# Patient Record
Sex: Male | Born: 1965 | Race: White | Hispanic: No | Marital: Married | Smoking: Never smoker
Health system: Southern US, Community
[De-identification: ages and names within clinical notes are randomized; demographics above are authoritative.]

## PROBLEM LIST (undated history)

## (undated) DIAGNOSIS — Z5189 Encounter for other specified aftercare: Secondary | ICD-10-CM

## (undated) DIAGNOSIS — I10 Essential (primary) hypertension: Secondary | ICD-10-CM

## (undated) DIAGNOSIS — I219 Acute myocardial infarction, unspecified: Secondary | ICD-10-CM

## (undated) DIAGNOSIS — D649 Anemia, unspecified: Secondary | ICD-10-CM

## (undated) DIAGNOSIS — K2270 Barrett's esophagus without dysplasia: Secondary | ICD-10-CM

## (undated) DIAGNOSIS — K2950 Unspecified chronic gastritis without bleeding: Secondary | ICD-10-CM

## (undated) DIAGNOSIS — E119 Type 2 diabetes mellitus without complications: Secondary | ICD-10-CM

## (undated) HISTORY — PX: CORONARY ARTERY BYPASS GRAFT: SHX141

## (undated) HISTORY — PX: CARDIAC CATHETERIZATION: SHX172

## (undated) HISTORY — DX: Anemia, unspecified: D64.9

## (undated) HISTORY — DX: Type 2 diabetes mellitus without complications: E11.9

## (undated) HISTORY — DX: Essential (primary) hypertension: I10

## (undated) HISTORY — DX: Unspecified chronic gastritis without bleeding: K29.50

## (undated) HISTORY — DX: Encounter for other specified aftercare: Z51.89

## (undated) HISTORY — DX: Acute myocardial infarction, unspecified: I21.9

## (undated) HISTORY — PX: CORONARY ANGIOPLASTY: SHX604

## (undated) HISTORY — PX: OTHER SURGICAL HISTORY: SHX169

---

## 2019-08-30 LAB — HM COLONOSCOPY

## 2021-09-02 NOTE — Progress Notes (Signed)
?I,Phillip Robles,acting as a scribe for [employer], PA-C.,have documented all relevant documentation on the behalf of Phillip Kirschner, PA-C,as directed by  Phillip Kirschner, PA-C while in the presence of Phillip Kirschner, PA-C. ? ?New Patient Office Visit ? ?Subjective:  ?Patient ID: Phillip Robles, male    DOB: 1966/03/27  Age: 56 y.o. MRN: [id number] ? ?CC: new patient ? ? ?HPI ?Phillip Robles presents for establishing care, coming from [facility]. Would like to discuss cardiovasular problems and gastrointestinal problems. ? ?DM2 ?--Does not check fasting blood sugars. Manages with jardiance 25 mg, metformin 1000 mg BID, victoza 1.8 mg daily. ?--Reports unchanged peripheral neuropathy, mainly a numbness bilaterally entire bottom of foot. 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  ? ?Anemia of unknown origin ?--Maybe two years ago, was hospitalized. S/p one iron infusion and stabilized.  ? ?History of MI ?--does not have cardiologist here. Reports Mis at 4, 8, 49, most recently 2019 s/p CABG. Reports at least 13 stents.  ?--manages risk with atorvastatin 80 mg and zetia 10 mg. Does not currently smoke. ? ?HTN w/ CHF?  ?--Reports last echo w/in the last calendar year was WNL. Takes Entresto 24-26 mg ? ?Left thumb ?--He reports injury 6 months ago, tried to catch a 2x4. Continues to have L thumb/hand pain. Denies edema, ecchymosis. Grip weakness. ? ?History of gastritis w/ metaplasia ?--Currently unmedicated. Reports being in the middle of a work up in [employer]  ? ?Reports not consuming alcohol since 2021. Denies history of cigarette use, other tobacco use that stopped in 2020.  ?Reports last colonoscopy was around 2 years ago when they were looking for a source of anemia. ?Family history + heart disease ? ?Unclear vaccination history, but he is requesting shingles vaccine today. ? ?Past Medical History:  ?Diagnosis Date  ? Anemia   ? Blood transfusion without reported diagnosis   ?  Chronic gastritis w/ Metaplasia   ? Diabetes mellitus without complication ([place])   ? Hypertension   ? Myocardial infarction [facility])   ? ? ?History reviewed. No pertinent surgical history. ? ?Family History  ?Problem Relation Age of Onset  ? Heart Problems Mother   ? Heart Problems Father   ? Bipolar disorder Sister   ? COPD Maternal Uncle   ? Heart Problems Paternal Uncle   ? Cancer Maternal Grandmother   ? Heart Problems Paternal Grandmother   ? Heart Problems Paternal Grandfather   ? ? ?Social History  ? ?Socioeconomic History  ? Marital status: Married  ?  Spouse name: Not on file  ? Number of children: Not on file  ? Years of education: Not on file  ? Highest education level: Not on file  ?Occupational History  ? Not on file  ?Tobacco Use  ? Smoking status: Never  ? Smokeless tobacco: Former  ?  Quit date: 03/2019  ?Substance and Sexual Activity  ? Alcohol use: Not Currently  ?  Comment: Quit 07/2019  ? Drug use: Never  ? Sexual activity: Yes  ?Other Topics Concern  ? Not on file  ?Social History Narrative  ? Not on file  ? ?Social Determinants of Health  ? ?Financial Resource Strain: Not on file  ?Food Insecurity: Not on file  ?Transportation Needs: Not on file  ?Physical Activity: Not on file  ?Stress: Not on file  ?Social Connections: Not on file  ?Intimate Partner Violence: Not on file  ? ? ?ROS ?Review of Systems  ?Eyes:  Positive for visual disturbance.  ?Musculoskeletal:  Positive for arthralgias and myalgias.  ? ?Objective:  ?Blood pressure 132/78, pulse 80, height 6\' 4"  (1.93 m), weight 233 lb 14.4 oz (106.1 kg), SpO2 99 %.  ?Today's Vitals: BP 132/78 (BP Location: Right Arm, Patient Position: Sitting, Cuff Size: Normal)   Pulse 80   Ht 6\' 4"  (1.93 m)   Wt 233 lb 14.4 oz (106.1 kg)   SpO2 99%   BMI 28.47 kg/m?  ? ?Physical Exam ?Constitutional:   ?   General: He is awake.  ?   Appearance: He is well-developed.  ?HENT:  ?   Head: Normocephalic.  ?Eyes:  ?   Conjunctiva/sclera: Conjunctivae normal.   ?Cardiovascular:  ?   Rate and Rhythm: Normal rate and regular rhythm.  ?   Pulses:     ?     Dorsalis pedis pulses are 3+ on the right side and 3+ on the left side.  ?   Heart sounds: Normal heart sounds.  ?Pulmonary:  ?   Effort: Pulmonary effort is normal.  ?   Breath sounds: Normal breath sounds.  ?Musculoskeletal:  ?   Right lower leg: No edema.  ?   Left lower leg: No edema.  ?Feet:  ?   Right foot:  ?   Protective Sensation: 3 sites tested.  0 sites sensed.  ?   Skin integrity: Skin integrity normal.  ?   Left foot:  ?   Protective Sensation: 3 sites tested.   1 site sensed. ?   Skin integrity: Skin integrity normal.  ?   Comments: L great toe surgically absent ?Skin: ?   General: Skin is warm.  ?Neurological:  ?   Mental Status: He is alert and oriented to person, place, and time.  ?Psychiatric:     ?   Attention and Perception: Attention normal.     ?   Mood and Affect: Mood normal.     ?   Speech: Speech normal.     ?   Behavior: Behavior is cooperative.  ? ? ?Assessment & Plan:  ? ?Problem List Items Addressed This Visit   ? ?  ? Cardiovascular and Mediastinum  ? Hypertension associated with diabetes ([place])  ?  Appropriate range today. Will continue to monitor ? ?  ?  ? Relevant Medications  ? atorvastatin (LIPITOR) 80 MG tablet  ? JARDIANCE 25 MG TABS tablet  ? ezetimibe (ZETIA) 10 MG tablet  ? VICTOZA 18 MG/3ML SOPN  ? metFORMIN (GLUCOPHAGE) 1000 MG tablet  ? metoprolol succinate (TOPROL-XL) 25 MG 24 hr tablet  ? ENTRESTO 24-26 MG  ? aspirin EC 81 MG tablet  ?  ? Digestive  ? Gastric intestinal metaplasia  ?  Historically, was in the middle of a w/u in [place], requests referral to GI. Not currently medicated ?  ?  ? Relevant Orders  ? Ambulatory referral to Gastroenterology  ? RESOLVED: GERD (gastroesophageal reflux disease)  ?  ? Endocrine  ? Diabetes mellitus without complication ([place]) - Primary  ?  Manages with metformin 1000 mg BID, jardiance 25 mg, Victoza 1.8 mg daily. ?Given a freestyle  libre, reports continuous monitoring helping before to control his sugars ?A1c. cmp ordered ?F/u 3 mo ?  ?  ? Relevant Medications  ? atorvastatin (LIPITOR) 80 MG tablet  ? JARDIANCE 25 MG TABS tablet  ? VICTOZA 18 MG/3ML SOPN  ? metFORMIN (GLUCOPHAGE) 1000 MG tablet  ? aspirin EC 81 MG tablet  ? Hyperlipidemia associated with type 2 diabetes mellitus (  [place])  ?  Will check lipid profile ?Manages with atorvastatin 80 mg and zetia 10 mg ?  ?  ? Relevant Medications  ? atorvastatin (LIPITOR) 80 MG tablet  ? JARDIANCE 25 MG TABS tablet  ? ezetimibe (ZETIA) 10 MG tablet  ? VICTOZA 18 MG/3ML SOPN  ? metFORMIN (GLUCOPHAGE) 1000 MG tablet  ? metoprolol succinate (TOPROL-XL) 25 MG 24 hr tablet  ? ENTRESTO 24-26 MG  ? aspirin EC 81 MG tablet  ? Other Relevant Orders  ? Comprehensive metabolic panel  ? Lipid panel  ?  ? Other  ? History of MI (myocardial infarction)  ?  Ref to cardiology, on statin, asa daily ?Will assess lipid panel, cmp ?  ?  ? Relevant Orders  ? Ambulatory referral to Cardiology  ? History of anemia  ?  Will eval cbc and iron profile ?  ?  ? Relevant Orders  ? CBC w/Diff/Platelet  ? Iron, TIBC and Ferritin Panel  ? History of diabetic retinopathy  ?  Ref to optho pt reports history of injections ?  ?  ? Relevant Orders  ? Ambulatory referral to Ophthalmology  ? Pain of left thumb  ?  Will start with xrays to assess for arthritis  ?Can refer to ortho in future if indicated ?  ?  ? Relevant Orders  ? DG Hand Complete Left  ? ?Other Visit Diagnoses   ? ? Encounter for screening for HIV      ? Relevant Orders  ? HIV antibody (with reflex)  ? Encounter for hepatitis C screening test for low risk patient      ? Relevant Orders  ? Hepatitis C antibody  ? Encounter for screening for malignant neoplasm of prostate      ? Relevant Orders  ? PSA  ? Need for shingles vaccine      ? Relevant Orders  ? Varicella-zoster vaccine IM (Shingrix) (Completed)  ? ?  ?  ? ?Outpatient Encounter Medications as of 09/03/2021   ?Medication Sig  ? aspirin EC 81 MG tablet Take 81 mg by mouth daily. Swallow whole.  ? atorvastatin (LIPITOR) 80 MG tablet Take 80 mg by mouth daily.  ? dorzolamide (TRUSOPT) 2 % ophthalmic solution SMARTSIG:In Eye(s)  ? ENTRES

## 2021-09-03 ENCOUNTER — Ambulatory Visit (INDEPENDENT_AMBULATORY_CARE_PROVIDER_SITE_OTHER): Payer: BC Managed Care – PPO | Admitting: Physician Assistant

## 2021-09-03 ENCOUNTER — Encounter: Payer: Self-pay | Admitting: Physician Assistant

## 2021-09-03 ENCOUNTER — Ambulatory Visit
Admission: RE | Admit: 2021-09-03 | Discharge: 2021-09-03 | Disposition: A | Discharge status: Home / Self Care | Payer: BC Managed Care – PPO | Attending: Physician Assistant | Admitting: Physician Assistant

## 2021-09-03 ENCOUNTER — Other Ambulatory Visit: Payer: Self-pay

## 2021-09-03 ENCOUNTER — Ambulatory Visit
Admission: RE | Admit: 2021-09-03 | Discharge: 2021-09-03 | Disposition: A | Discharge status: Home / Self Care | Payer: BC Managed Care – PPO | Source: Ambulatory Visit | Attending: Physician Assistant | Admitting: Physician Assistant

## 2021-09-03 VITALS — BP 132/78 | HR 80 | Ht 76.0 in | Wt 233.9 lb

## 2021-09-03 DIAGNOSIS — E1142 Type 2 diabetes mellitus with diabetic polyneuropathy: Secondary | ICD-10-CM

## 2021-09-03 DIAGNOSIS — Z1159 Encounter for screening for other viral diseases: Secondary | ICD-10-CM

## 2021-09-03 DIAGNOSIS — E1169 Type 2 diabetes mellitus with other specified complication: Secondary | ICD-10-CM

## 2021-09-03 DIAGNOSIS — E785 Hyperlipidemia, unspecified: Secondary | ICD-10-CM

## 2021-09-03 DIAGNOSIS — I252 Old myocardial infarction: Secondary | ICD-10-CM | POA: Insufficient documentation

## 2021-09-03 DIAGNOSIS — Z23 Encounter for immunization: Secondary | ICD-10-CM | POA: Diagnosis not present

## 2021-09-03 DIAGNOSIS — Z8739 Personal history of other diseases of the musculoskeletal system and connective tissue: Secondary | ICD-10-CM

## 2021-09-03 DIAGNOSIS — E119 Type 2 diabetes mellitus without complications: Secondary | ICD-10-CM | POA: Diagnosis not present

## 2021-09-03 DIAGNOSIS — Z114 Encounter for screening for human immunodeficiency virus [HIV]: Secondary | ICD-10-CM | POA: Diagnosis not present

## 2021-09-03 DIAGNOSIS — M79642 Pain in left hand: Secondary | ICD-10-CM | POA: Diagnosis not present

## 2021-09-03 DIAGNOSIS — K31A0 Gastric intestinal metaplasia, unspecified: Secondary | ICD-10-CM

## 2021-09-03 DIAGNOSIS — E1159 Type 2 diabetes mellitus with other circulatory complications: Secondary | ICD-10-CM

## 2021-09-03 DIAGNOSIS — I152 Hypertension secondary to endocrine disorders: Secondary | ICD-10-CM

## 2021-09-03 DIAGNOSIS — Z862 Personal history of diseases of the blood and blood-forming organs and certain disorders involving the immune mechanism: Secondary | ICD-10-CM

## 2021-09-03 DIAGNOSIS — K219 Gastro-esophageal reflux disease without esophagitis: Secondary | ICD-10-CM

## 2021-09-03 DIAGNOSIS — M79645 Pain in left finger(s): Secondary | ICD-10-CM | POA: Diagnosis not present

## 2021-09-03 DIAGNOSIS — Z125 Encounter for screening for malignant neoplasm of prostate: Secondary | ICD-10-CM

## 2021-09-03 DIAGNOSIS — Z8639 Personal history of other endocrine, nutritional and metabolic disease: Secondary | ICD-10-CM

## 2021-09-03 NOTE — Assessment & Plan Note (Signed)
Ref to optho pt reports history of injections ?

## 2021-09-03 NOTE — Assessment & Plan Note (Addendum)
Historically, was in the middle of a w/u in [place], requests referral to GI. Not currently medicated ?

## 2021-09-03 NOTE — Assessment & Plan Note (Signed)
Ref to cardiology, on statin, asa daily ?Will assess lipid panel, cmp ?

## 2021-09-03 NOTE — Assessment & Plan Note (Signed)
Will check lipid profile ?Manages with atorvastatin 80 mg and zetia 10 mg ?

## 2021-09-03 NOTE — Assessment & Plan Note (Addendum)
Manages with metformin 1000 mg BID, jardiance 25 mg, Victoza 1.8 mg daily. ?Given a freestyle libre, reports continuous monitoring helping before to control his sugars ?A1c. cmp ordered ?F/u 3 mo ?

## 2021-09-03 NOTE — Assessment & Plan Note (Signed)
Appropriate range today. Will continue to monitor ? ?

## 2021-09-03 NOTE — Assessment & Plan Note (Signed)
Will start with xrays to assess for arthritis  ?Can refer to ortho in future if indicated ?

## 2021-09-03 NOTE — Assessment & Plan Note (Signed)
Will eval cbc and iron profile ?

## 2021-09-04 ENCOUNTER — Other Ambulatory Visit: Payer: Self-pay | Admitting: Physician Assistant

## 2021-09-04 DIAGNOSIS — R748 Abnormal levels of other serum enzymes: Secondary | ICD-10-CM

## 2021-09-04 LAB — COMPREHENSIVE METABOLIC PANEL
ALT: 31 IU/L (ref 0–44)
AST: 28 IU/L (ref 0–40)
Albumin/Globulin Ratio: 2.1 (ref 1.2–2.2)
Albumin: 4.7 g/dL (ref 3.8–4.9)
Alkaline Phosphatase: 168 IU/L — ABNORMAL HIGH (ref 44–121)
BUN/Creatinine Ratio: 19 (ref 9–20)
BUN: 19 mg/dL (ref 6–24)
Bilirubin Total: 1.6 mg/dL — ABNORMAL HIGH (ref 0.0–1.2)
CO2: 22 mmol/L (ref 20–29)
Calcium: 10.5 mg/dL — ABNORMAL HIGH (ref 8.7–10.2)
Chloride: 103 mmol/L (ref 96–106)
Creatinine, Ser: 1 mg/dL (ref 0.76–1.27)
Globulin, Total: 2.2 g/dL (ref 1.5–4.5)
Glucose: 140 mg/dL — ABNORMAL HIGH (ref 70–99)
Potassium: 4.9 mmol/L (ref 3.5–5.2)
Sodium: 143 mmol/L (ref 134–144)
Total Protein: 6.9 g/dL (ref 6.0–8.5)
eGFR: 88 mL/min/{1.73_m2} (ref >59)

## 2021-09-04 LAB — PSA: Prostate Specific Ag, Serum: 0.5 ng/mL (ref 0.0–4.0)

## 2021-09-04 LAB — LIPID PANEL
Chol/HDL Ratio: 2.3 ratio (ref 0.0–5.0)
Cholesterol, Total: 116 mg/dL (ref 100–199)
HDL: 51 mg/dL (ref >39)
LDL Chol Calc (NIH): 48 mg/dL (ref 0–99)
Triglycerides: 90 mg/dL (ref 0–149)
VLDL Cholesterol Cal: 17 mg/dL (ref 5–40)

## 2021-09-04 LAB — IRON,TIBC AND FERRITIN PANEL
Ferritin: 68 ng/mL (ref 30–400)
Iron Saturation: 35 % (ref 15–55)
Iron: 129 ug/dL (ref 38–169)
Total Iron Binding Capacity: 369 ug/dL (ref 250–450)
UIBC: 240 ug/dL (ref 111–343)

## 2021-09-04 LAB — CBC WITH DIFFERENTIAL/PLATELET
Basophils Absolute: 0.1 10*3/uL (ref 0.0–0.2)
Basos: 1 %
EOS (ABSOLUTE): 0.2 10*3/uL (ref 0.0–0.4)
Eos: 4 %
Hematocrit: 43.8 % (ref 37.5–51.0)
Hemoglobin: 15 g/dL (ref 13.0–17.7)
Immature Grans (Abs): 0 10*3/uL (ref 0.0–0.1)
Immature Granulocytes: 0 %
Lymphocytes Absolute: 0.9 10*3/uL (ref 0.7–3.1)
Lymphs: 14 %
MCH: 31.4 pg (ref 26.6–33.0)
MCHC: 34.2 g/dL (ref 31.5–35.7)
MCV: 92 fL (ref 79–97)
Monocytes Absolute: 0.4 10*3/uL (ref 0.1–0.9)
Monocytes: 6 %
Neutrophils Absolute: 4.8 10*3/uL (ref 1.4–7.0)
Neutrophils: 75 %
Platelets: 144 10*3/uL — ABNORMAL LOW (ref 150–450)
RBC: 4.78 x10E6/uL (ref 4.14–5.80)
RDW: 12.8 % (ref 11.6–15.4)
WBC: 6.4 10*3/uL (ref 3.4–10.8)

## 2021-09-04 LAB — HIV ANTIBODY (ROUTINE TESTING W REFLEX): HIV Screen 4th Generation wRfx: NONREACTIVE

## 2021-09-04 LAB — HEMOGLOBIN A1C
Est. average glucose Bld gHb Est-mCnc: 166 mg/dL
Hgb A1c MFr Bld: 7.4 % — ABNORMAL HIGH (ref 4.8–5.6)

## 2021-09-04 LAB — HEPATITIS C ANTIBODY: Hep C Virus Ab: NONREACTIVE

## 2021-09-07 ENCOUNTER — Encounter: Payer: Self-pay | Admitting: Physician Assistant

## 2021-09-08 ENCOUNTER — Other Ambulatory Visit: Payer: Self-pay | Admitting: Physician Assistant

## 2021-09-08 DIAGNOSIS — M79645 Pain in left finger(s): Secondary | ICD-10-CM

## 2021-09-09 DIAGNOSIS — E11311 Type 2 diabetes mellitus with unspecified diabetic retinopathy with macular edema: Secondary | ICD-10-CM | POA: Diagnosis not present

## 2021-09-09 DIAGNOSIS — Z0100 Encounter for examination of eyes and vision without abnormal findings: Secondary | ICD-10-CM | POA: Diagnosis not present

## 2021-09-09 LAB — HM DIABETES EYE EXAM

## 2021-09-10 DIAGNOSIS — M79645 Pain in left finger(s): Secondary | ICD-10-CM | POA: Diagnosis not present

## 2021-09-22 DIAGNOSIS — E113313 Type 2 diabetes mellitus with moderate nonproliferative diabetic retinopathy with macular edema, bilateral: Secondary | ICD-10-CM | POA: Diagnosis not present

## 2021-09-22 LAB — HM DIABETES EYE EXAM

## 2021-09-23 DIAGNOSIS — M79645 Pain in left finger(s): Secondary | ICD-10-CM | POA: Diagnosis not present

## 2021-09-29 DIAGNOSIS — S5332XA Traumatic rupture of left ulnar collateral ligament, initial encounter: Secondary | ICD-10-CM | POA: Diagnosis not present

## 2021-09-30 DIAGNOSIS — E113313 Type 2 diabetes mellitus with moderate nonproliferative diabetic retinopathy with macular edema, bilateral: Secondary | ICD-10-CM | POA: Diagnosis not present

## 2021-10-01 ENCOUNTER — Ambulatory Visit (INDEPENDENT_AMBULATORY_CARE_PROVIDER_SITE_OTHER): Payer: BC Managed Care – PPO | Admitting: Cardiology

## 2021-10-01 ENCOUNTER — Encounter: Payer: Self-pay | Admitting: Cardiology

## 2021-10-01 VITALS — BP 112/70 | HR 94 | Ht 76.0 in | Wt 235.0 lb

## 2021-10-01 DIAGNOSIS — I10 Essential (primary) hypertension: Secondary | ICD-10-CM | POA: Diagnosis not present

## 2021-10-01 DIAGNOSIS — E7800 Pure hypercholesterolemia, unspecified: Secondary | ICD-10-CM

## 2021-10-01 DIAGNOSIS — Z951 Presence of aortocoronary bypass graft: Secondary | ICD-10-CM | POA: Diagnosis not present

## 2021-10-01 NOTE — Patient Instructions (Signed)
Medication Instructions:  ? ?Your physician recommends that you continue on your current medications as directed. Please refer to the Current Medication list given to you today. ? ?*If you need a refill on your cardiac medications before your next appointment, please call your pharmacy* ? ? ?Lab Work: ?None ordered ? ?If you have labs (blood work) drawn today and your tests are completely normal, you will receive your results only by: ?MyChart Message (if you have MyChart) OR ?A paper copy in the mail ?If you have any lab test that is abnormal or we need to change your treatment, we will call you to review the results. ? ? ?Testing/Procedures: ? ?Your physician has requested that you have an echocardiogram. Echocardiography is a painless test that uses sound waves to create images of your heart. It provides your doctor with information about the size and shape of your heart and how well your heart?s chambers and valves are working. This procedure takes approximately one hour. There are no restrictions for this procedure. ? ? ? ?Follow-Up: ?At [facility], you and your health needs are our priority.  As part of our continuing mission to provide you with exceptional heart care, we have created designated Provider Care Teams.  These Care Teams include your primary Cardiologist (physician) and Advanced Practice Providers (APPs -  Physician Assistants and Nurse Practitioners) who all work together to provide you with the care you need, when you need it. ? ?We recommend signing up for the patient portal called "MyChart".  Sign up information is provided on this After Visit Summary.  MyChart is used to connect with patients for Virtual Visits (Telemedicine).  Patients are able to view lab/test results, encounter notes, upcoming appointments, etc.  Non-urgent messages can be sent to your provider as well.   ?To learn more about what you can do with MyChart, go to [email or web address].   ? ?Your next appointment:    ?2-3 month(s) ? ?The format for your next appointment:   ?In Person ? ?Provider:   ?You may see Debbe Odea, MD or one of the following Advanced Practice Providers on your designated Care Team:   ?Nicolasa Ducking, NP ?Eula Listen, PA-C ?Cadence Fransico Michael, PA-C  ? ? ?Other Instructions ? ? ?Important Information About Sugar ? ? ? ? ? ? ?

## 2021-10-01 NOTE — Progress Notes (Signed)
?Cardiology Office Note:   ? ?Date:  10/01/2021  ? ?ID:  Phillip Robles, DOB 10-Mar-1966, MRN [id number] ? ?PCP:  [name], PA-C ?  ?[facility] Providers ?Cardiologist:  None    ? ?Referring MD: [name], PA-C  ? ?Chief Complaint  ?Patient presents with  ? New Patient (Initial Visit)  ?  Referred by PCP for hx with MI / CABG. Meds reviewed verbally with patient.   ? ?Phillip Robles is a 56 y.o. male who is being seen today for the evaluation of cardiovascular risk at the request of Drubel, Ria Comment, [place]. ? ? ?History of Present Illness:   ? ?Phillip Robles is a 56 y.o. male with a hx of CAD (prior PCI 2007, 2009), MI s/p CABG x 5 2009, hypertension, hyperlipidemia, diabetes who presents to establish care. ? ?Patient recently moved to the area from [facility].  Had an MI in 2007 requiring stents.  2 years later he had additional stent placements.  In December 2009, due to persistent symptoms of chest discomfort, he underwent left heart cath showing multivessel disease requiring CABG.  A repeat left heart cath in 2011 and was told everything was okay. ? ?Has been doing well over the past year or so.  Denies chest pain or shortness of breath.  Takes medications as prescribed. ? ? ? ?Past Medical History:  ?Diagnosis Date  ? Anemia   ? Blood transfusion without reported diagnosis   ? Chronic gastritis w/ Metaplasia   ? Diabetes mellitus without complication ([place])   ? Hypertension   ? Myocardial infarction [facility])   ? ? ?History reviewed. No pertinent surgical history. ? ?Current Medications: ?Current Meds  ?Medication Sig  ? aspirin EC 81 MG tablet Take 81 mg by mouth daily. Swallow whole.  ? atorvastatin (LIPITOR) 80 MG tablet Take 80 mg by mouth daily.  ? dorzolamide (TRUSOPT) 2 % ophthalmic solution SMARTSIG:In Eye(s)  ? ENTRESTO 24-26 MG Take 1 tablet by mouth 2 (two) times daily.  ? ezetimibe (ZETIA) 10 MG tablet Take 10 mg by mouth daily.  ? JARDIANCE 25 MG TABS tablet Take 25 mg by mouth daily.   ? metFORMIN (GLUCOPHAGE) 1000 MG tablet Take 1,000 mg by mouth 2 (two) times daily.  ? metoprolol succinate (TOPROL-XL) 25 MG 24 hr tablet Take 25 mg by mouth 2 (two) times daily.  ? timolol (TIMOPTIC) 0.5 % ophthalmic solution SMARTSIG:In Eye(s)  ? VICTOZA 18 MG/3ML SOPN Inject 1.8 mg into the skin daily.  ?  ? ?Allergies:   Other  ? ?Social History  ? ?Socioeconomic History  ? Marital status: Married  ?  Spouse name: Not on file  ? Number of children: Not on file  ? Years of education: Not on file  ? Highest education level: Not on file  ?Occupational History  ? Not on file  ?Tobacco Use  ? Smoking status: Never  ? Smokeless tobacco: Former  ?  Quit date: 03/2019  ?Substance and Sexual Activity  ? Alcohol use: Not Currently  ?  Comment: Quit 07/2019  ? Drug use: Never  ? Sexual activity: Yes  ?Other Topics Concern  ? Not on file  ?Social History Narrative  ? Not on file  ? ?Social Determinants of Health  ? ?Financial Resource Strain: Not on file  ?Food Insecurity: Not on file  ?Transportation Needs: Not on file  ?Physical Activity: Not on file  ?Stress: Not on file  ?Social Connections: Not on file  ?  ? ?Family History: ?  The patient's family history includes Bipolar disorder in his sister; COPD in his maternal uncle; Cancer in his maternal grandmother; Heart Problems in his father, mother, paternal grandfather, paternal grandmother, and paternal uncle. ? ?ROS:   ?Please see the history of present illness.    ? All other systems reviewed and are negative. ? ?EKGs/Labs/Other Studies Reviewed:   ? ?The following studies were reviewed today: ? ? ?EKG:  EKG is  ordered today.  The ekg ordered today demonstrates normal sinus rhythm, low voltage QRS, possible old inferior infarct ? ?Recent Labs: ?09/03/2021: ALT 31; BUN 19; Creatinine, Ser 1.00; Hemoglobin 15.0; Platelets 144; Potassium 4.9; Sodium 143  ?Recent Lipid Panel ?   ?Component Value Date/Time  ? CHOL 116 09/03/2021 1114  ? TRIG 90 09/03/2021 1114  ? HDL 51  09/03/2021 1114  ? CHOLHDL 2.3 09/03/2021 1114  ? [place] 48 09/03/2021 1114  ? ? ? ?Risk Assessment/Calculations:   ? ? ?    ? ?Physical Exam:   ? ?VS:  BP 112/70 (BP Location: Left Arm, Patient Position: Sitting, Cuff Size: Normal)   Pulse 94   Ht 6\' 4"  (1.93 m)   Wt 235 lb (106.6 kg)   SpO2 97%   BMI 28.61 kg/m?    ? ?Wt Readings from Last 3 Encounters:  ?10/01/21 235 lb (106.6 kg)  ?09/03/21 233 lb 14.4 oz (106.1 kg)  ?  ? ?GEN:  Well nourished, well developed in no acute distress ?HEENT: Normal ?NECK: No JVD; No carotid bruits ?CARDIAC: RRR, no murmurs, rubs, gallops ?RESPIRATORY:  Clear to auscultation without rales, wheezing or rhonchi  ?ABDOMEN: Soft, non-tender, non-distended ?MUSCULOSKELETAL:  No edema; No deformity  ?SKIN: Warm and dry ?NEUROLOGIC:  Alert and oriented x 3 ?PSYCHIATRIC:  Normal affect  ? ?ASSESSMENT:   ? ?1. Hx of CABG   ?2. Primary hypertension   ?3. Pure hypercholesterolemia   ? ?PLAN:   ? ?In order of problems listed above: ? ?CAD/CABG x5.  Denies chest pain.  Get echocardiogram to evaluate function.  Continue aspirin, Toprol-XL, Lipitor 80. ?Hypertension, BP controlled.  Continue Toprol-XL, Entresto. ?Hyperlipidemia, cholesterol controlled, LDL at goal.  Continue Lipitor 80. ? ?Follow-up after echo.  If echo otherwise normal, will plan for annual visits. ? ?   ? ? ? ?Medication Adjustments/Labs and Tests Ordered: ?Current medicines are reviewed at length with the patient today.  Concerns regarding medicines are outlined above.  ?Orders Placed This Encounter  ?Procedures  ? ECHOCARDIOGRAM COMPLETE  ? ?No orders of the defined types were placed in this encounter. ? ? ?Patient Instructions  ?Medication Instructions:  ? ?Your physician recommends that you continue on your current medications as directed. Please refer to the Current Medication list given to you today. ? ?*If you need a refill on your cardiac medications before your next appointment, please call your pharmacy* ? ? ?Lab  Work: ?None ordered ? ?If you have labs (blood work) drawn today and your tests are completely normal, you will receive your results only by: ?MyChart Message (if you have MyChart) OR ?A paper copy in the mail ?If you have any lab test that is abnormal or we need to change your treatment, we will call you to review the results. ? ? ?Testing/Procedures: ? ?Your physician has requested that you have an echocardiogram. Echocardiography is a painless test that uses sound waves to create images of your heart. It provides your doctor with information about the size and shape of your heart and how well  your heart?s chambers and valves are working. This procedure takes approximately one hour. There are no restrictions for this procedure. ? ? ? ?Follow-Up: ?At [facility], you and your health needs are our priority.  As part of our continuing mission to provide you with exceptional heart care, we have created designated Provider Care Teams.  These Care Teams include your primary Cardiologist (physician) and Advanced Practice Providers (APPs -  Physician Assistants and Nurse Practitioners) who all work together to provide you with the care you need, when you need it. ? ?We recommend signing up for the patient portal called "MyChart".  Sign up information is provided on this After Visit Summary.  MyChart is used to connect with patients for Virtual Visits (Telemedicine).  Patients are able to view lab/test results, encounter notes, upcoming appointments, etc.  Non-urgent messages can be sent to your provider as well.   ?To learn more about what you can do with MyChart, go to [email or web address].   ? ?Your next appointment:   ?2-3 month(s) ? ?The format for your next appointment:   ?In Person ? ?Provider:   ?You may see Kate Sable, MD or one of the following Advanced Practice Providers on your designated Care Team:   ?Murray Hodgkins, NP ?Christell Faith, PA-C ?Cadence Kathlen Mody, PA-C  ? ? ?Other  Instructions ? ? ?Important Information About Sugar ? ? ? ? ? ?  ? ?Signed, ?Kate Sable, MD  ?10/01/2021 12:38 PM    ?New Site ?

## 2021-10-02 ENCOUNTER — Other Ambulatory Visit: Payer: Self-pay | Admitting: Cardiology

## 2021-10-02 DIAGNOSIS — I25119 Atherosclerotic heart disease of native coronary artery with unspecified angina pectoris: Secondary | ICD-10-CM

## 2021-10-02 DIAGNOSIS — Z951 Presence of aortocoronary bypass graft: Secondary | ICD-10-CM

## 2021-10-05 NOTE — Addendum Note (Signed)
Addended by: Margrett Rud on: 10/05/2021 01:23 PM ? ? Modules accepted: Orders ? ?

## 2021-10-19 ENCOUNTER — Telehealth: Payer: Self-pay | Admitting: Cardiology

## 2021-10-19 NOTE — Telephone Encounter (Signed)
I will update the requesting office the pt has an echo 10/28/21 for further cardiac testing for pre op clearance.

## 2021-10-19 NOTE — Telephone Encounter (Signed)
Primary Cardiologist:None  Chart reviewed as part of pre-operative protocol coverage. Because of Phillip Robles's past medical history he has an echocardiogram ordered by Dr. Garen Lah that is pending In order to better assess preoperative cardiovascular risk, we will need to await results prior to giving clearance.   Pre-op covering staff: - Please contact requesting surgeon's office via preferred method (i.e, phone, fax) to inform them of need for appointment prior to surgery. Echo is scheduled for 10/28/21.  If applicable, this message will also be routed to pharmacy pool and/or primary cardiologist for input on holding anticoagulant/antiplatelet agent as requested below so that this information is available at time of patient's appointment.   Phillip Life, NP-C    10/19/2021, 4:11 PM [place] [id number], [address] Office [phone] Fax [phone]

## 2021-10-19 NOTE — Telephone Encounter (Signed)
   Pre-operative Risk Assessment    Patient Name: Phillip Robles  DOB: 1965-07-07 MRN: [id number]      Request for Surgical Clearance    Procedure:   left thumb ucl vs. reconstruction  Date of Surgery:  Clearance 10/30/21                                 Surgeon:  Dr Audery Amel Group or Practice Name:  [facility]  Phone number:  [phone] Fax number:  [phone]   Type of Clearance Requested:   - Medical    Type of Anesthesia:   Regional block   Additional requests/questions:    Courtney Heys   10/19/2021, 3:51 PM

## 2021-10-28 ENCOUNTER — Ambulatory Visit (INDEPENDENT_AMBULATORY_CARE_PROVIDER_SITE_OTHER): Payer: BC Managed Care – PPO

## 2021-10-28 DIAGNOSIS — I25119 Atherosclerotic heart disease of native coronary artery with unspecified angina pectoris: Secondary | ICD-10-CM

## 2021-10-28 DIAGNOSIS — Z951 Presence of aortocoronary bypass graft: Secondary | ICD-10-CM | POA: Diagnosis not present

## 2021-10-28 LAB — ECHOCARDIOGRAM COMPLETE
AR max vel: 3.37 cm2
AV Area VTI: 3.97 cm2
AV Area mean vel: 3.58 cm2
AV Mean grad: 2 mmHg
AV Peak grad: 4.4 mmHg
Ao pk vel: 1.05 m/s

## 2021-10-28 MED ORDER — PERFLUTREN LIPID MICROSPHERE
1.0000 mL | INTRAVENOUS | Status: AC | PRN
  Administered 2021-10-28: 2 mL via INTRAVENOUS

## 2021-10-29 ENCOUNTER — Telehealth: Payer: Self-pay

## 2021-10-29 NOTE — Telephone Encounter (Signed)
   Name: Phillip Robles  DOB: 1965-06-08  MRN: [id number]   Primary Cardiologist: None  Chart reviewed as part of pre-operative protocol coverage. Patient was contacted 10/29/2021 in reference to pre-operative risk assessment for pending surgery as outlined below.  Phillip Robles was last seen on 10/01/2021 by Dr. Azucena Cecil.  Since that day, Phillip Robles has done well without exertional chest pain or worsening dyspnea.  He is able to walk several miles per day without any issue.  He is clearly able to accomplish more than 4 METS of activity.  Echocardiogram obtained yesterday showed EF 45 to 50% which is borderline low, we do not have a old echocardiogram to compare, however patient says his previous cardiologist mention his EF is closer to 50 to 55%.  Given similarity in the ejection fraction and the lack of symptom, patient is at acceptable risk to proceed with surgery without further cardiovascular testing.  Therefore, based on ACC/AHA guidelines, the patient would be at acceptable risk for the planned procedure without further cardiovascular testing.   The patient was advised that if he develops new symptoms prior to surgery to contact our office to arrange for a follow-up visit, and he verbalized understanding.  I will route this recommendation to the requesting party via Epic fax function and remove from pre-op pool. Please call with questions.  [place], [place] 10/29/2021, 3:42 PM

## 2021-10-29 NOTE — Telephone Encounter (Signed)
Office calling back for update because surgery is tomorrow. Fax [phone]. Please advise

## 2021-10-29 NOTE — Telephone Encounter (Signed)
Patient attempted to email you but the email didn't work.  His email is [email or web address]

## 2021-10-30 DIAGNOSIS — Z7984 Long term (current) use of oral hypoglycemic drugs: Secondary | ICD-10-CM | POA: Diagnosis not present

## 2021-10-30 DIAGNOSIS — Z7982 Long term (current) use of aspirin: Secondary | ICD-10-CM | POA: Diagnosis not present

## 2021-10-30 DIAGNOSIS — Z951 Presence of aortocoronary bypass graft: Secondary | ICD-10-CM | POA: Diagnosis not present

## 2021-10-30 DIAGNOSIS — G8918 Other acute postprocedural pain: Secondary | ICD-10-CM | POA: Diagnosis not present

## 2021-10-30 DIAGNOSIS — M79642 Pain in left hand: Secondary | ICD-10-CM | POA: Diagnosis not present

## 2021-10-30 DIAGNOSIS — S5332XA Traumatic rupture of left ulnar collateral ligament, initial encounter: Secondary | ICD-10-CM | POA: Diagnosis not present

## 2021-10-30 DIAGNOSIS — X58XXXA Exposure to other specified factors, initial encounter: Secondary | ICD-10-CM | POA: Diagnosis not present

## 2021-10-30 DIAGNOSIS — Z7985 Long-term (current) use of injectable non-insulin antidiabetic drugs: Secondary | ICD-10-CM | POA: Diagnosis not present

## 2021-11-11 DIAGNOSIS — S5330XD Traumatic rupture of unspecified ulnar collateral ligament, subsequent encounter: Secondary | ICD-10-CM | POA: Diagnosis not present

## 2021-11-16 DIAGNOSIS — S5332XD Traumatic rupture of left ulnar collateral ligament, subsequent encounter: Secondary | ICD-10-CM | POA: Diagnosis not present

## 2021-12-03 ENCOUNTER — Ambulatory Visit: Payer: BC Managed Care – PPO | Admitting: Physician Assistant

## 2021-12-08 DIAGNOSIS — S5332XD Traumatic rupture of left ulnar collateral ligament, subsequent encounter: Secondary | ICD-10-CM | POA: Diagnosis not present

## 2021-12-08 NOTE — Progress Notes (Unsigned)
I,Sha'taria Tyson,acting as a Education administrator for [employer], PA-C.,have documented all relevant documentation on the behalf of Phillip Kirschner, PA-C,as directed by  Phillip Kirschner, PA-C while in the presence of Phillip Kirschner, PA-C.   Established patient visit   Patient: Phillip Robles   DOB: 1965-07-11   56 y.o. Male  MRN: [id number] Visit Date: 12/09/2021  Today's healthcare provider: [name], PA-C   Cc. dmII f/u  Subjective    HPI  Diabetes Mellitus Type II, Follow-up  Lab Results  Component Value Date   HGBA1C 7.8 (A) 12/09/2021   HGBA1C 7.4 (H) 09/03/2021   Wt Readings from Last 3 Encounters:  12/09/21 234 lb (106.1 kg)  12/09/21 235 lb 6.4 oz (106.8 kg)  10/01/21 235 lb (106.6 kg)   Last seen for diabetes 3 months ago.  Management since then includes metformin 1000 mg BID, jardiance 25 mg, Victoza 1.8 mg daily. He reports excellent compliance with treatment. He is not having side effects.  Symptoms: No fatigue No foot ulcerations  No appetite changes Yes nausea  Yes paresthesia of the feet  No polydipsia  No polyuria No visual disturbances   No vomiting    Home blood sugar records:  not being checked  Episodes of hypoglycemia? Unknown not checking   Current insulin regiment: none Most Recent Eye Exam: UTD Current exercise: gardening and walking Current diet habits: well balanced  Pertinent Labs: Lab Results  Component Value Date   CHOL 116 09/03/2021   HDL 51 09/03/2021   LDLCALC 48 09/03/2021   TRIG 90 09/03/2021   CHOLHDL 2.3 09/03/2021   Lab Results  Component Value Date   NA 143 09/03/2021   K 4.9 09/03/2021   CREATININE 1.00 09/03/2021   EGFR 88 09/03/2021     ---------------------------------------------------------------------------------------------------   Medications: Outpatient Medications Prior to Visit  Medication Sig   aspirin EC 81 MG tablet Take 81 mg by mouth daily. Swallow whole.   atorvastatin (LIPITOR) 80 MG tablet  Take 80 mg by mouth daily.   dorzolamide (TRUSOPT) 2 % ophthalmic solution SMARTSIG:In Eye(s)   ENTRESTO 24-26 MG Take 1 tablet by mouth 2 (two) times daily.   ezetimibe (ZETIA) 10 MG tablet Take 10 mg by mouth daily.   ibuprofen (ADVIL) 600 MG tablet Take 600 mg by mouth 3 (three) times daily.   JARDIANCE 25 MG TABS tablet Take 25 mg by mouth daily.   metFORMIN (GLUCOPHAGE) 1000 MG tablet Take 1,000 mg by mouth 2 (two) times daily.   metoprolol succinate (TOPROL-XL) 25 MG 24 hr tablet Take 25 mg by mouth 2 (two) times daily.   ondansetron (ZOFRAN-ODT) 4 MG disintegrating tablet Take one tablet po q 12 prn nausea.   timolol (BETIMOL) 0.25 % ophthalmic solution    timolol (TIMOPTIC) 0.5 % ophthalmic solution SMARTSIG:In Eye(s)   VICTOZA 18 MG/3ML SOPN Inject 1.8 mg into the skin daily.   [DISCONTINUED] oxyCODONE (OXY IR/ROXICODONE) 5 MG immediate release tablet Take by mouth.   No facility-administered medications prior to visit.    Review of Systems  Constitutional:  Negative for fatigue and fever.  Respiratory:  Negative for cough and shortness of breath.   Cardiovascular:  Negative for chest pain, palpitations and leg swelling.  Neurological:  Negative for dizziness and headaches.      Objective    Blood pressure (!) 137/96, pulse 92, height 6' 4"  (1.93 m), weight 235 lb 6.4 oz (106.8 kg), SpO2 99 %.   Physical Exam Constitutional:  General: He is awake.     Appearance: He is well-developed.  HENT:     Head: Normocephalic.  Eyes:     Conjunctiva/sclera: Conjunctivae normal.  Cardiovascular:     Rate and Rhythm: Normal rate and regular rhythm.     Heart sounds: Normal heart sounds.  Pulmonary:     Effort: Pulmonary effort is normal.     Breath sounds: Normal breath sounds.  Skin:    General: Skin is warm.  Neurological:     Mental Status: He is alert and oriented to person, place, and time.  Psychiatric:        Attention and Perception: Attention normal.         Mood and Affect: Mood normal.        Speech: Speech normal.        Behavior: Behavior is cooperative.      Results for orders placed or performed in visit on 12/09/21  POCT glycosylated hemoglobin (Hb A1C)  Result Value Ref Range   Hemoglobin A1C 7.8 (A) 4.0 - 5.6 %   HbA1c POC (<> result, manual entry)     HbA1c, POC (prediabetic range)     HbA1c, POC (controlled diabetic range)      Assessment & Plan      Problem List Items Addressed This Visit       Cardiovascular and Mediastinum   Hypertension associated with diabetes ([place])    Elevated in office today but pt states he did not take his meds. Advised him to be consistent and check at home. Ordered cmp F/u 80mo       Endocrine   Type 2 diabetes mellitus with diabetic polyneuropathy, without long-term current use of insulin (HCC) - Primary    A1c up to 7.8% from 7.4% ; uncontrolled  Long conversation regarding diet and exercise. Pt currently managed w/ metformin 1000 mg BID, jardiance 25 mg, Victoza 1.8 mg daily. Next option would be adding another medication Discussed referral to nutritionist, pt declined On statin Foot exam utd uacr today F/u 3 mo       Relevant Orders   POCT glycosylated hemoglobin (Hb A1C) (Completed)   Urine Microalbumin w/creat. ratio   Hyperlipidemia associated with type 2 diabetes mellitus (HCC)    At goal Continue atorvastatin 80 and zetia 10 mg          Other   Elevated alkaline phosphatase level    Likely secondary to uncontrolled dm  Will monitor      Relevant Orders   Comprehensive Metabolic Panel (CMET)    Return in about 3 months (around 03/11/2022) for hypertension, DMII.      I, LMikey Kirschner PA-C have reviewed all documentation for this visit. The documentation on  12/09/2021 for the exam, diagnosis, procedures, and orders are all accurate and complete.  LMikey Kirschner PA-C BLillian [facility]. [facility] [place] [address]Office:  [phone]: 3[place]

## 2021-12-09 ENCOUNTER — Ambulatory Visit (INDEPENDENT_AMBULATORY_CARE_PROVIDER_SITE_OTHER): Payer: BC Managed Care – PPO | Admitting: Gastroenterology

## 2021-12-09 ENCOUNTER — Ambulatory Visit (INDEPENDENT_AMBULATORY_CARE_PROVIDER_SITE_OTHER): Payer: BC Managed Care – PPO | Admitting: Physician Assistant

## 2021-12-09 ENCOUNTER — Encounter: Payer: Self-pay | Admitting: Physician Assistant

## 2021-12-09 ENCOUNTER — Encounter: Payer: Self-pay | Admitting: Gastroenterology

## 2021-12-09 VITALS — BP 137/96 | HR 92 | Ht 76.0 in | Wt 235.4 lb

## 2021-12-09 VITALS — BP 125/75 | HR 96 | Temp 98.6°F | Ht 76.0 in | Wt 234.0 lb

## 2021-12-09 DIAGNOSIS — R748 Abnormal levels of other serum enzymes: Secondary | ICD-10-CM | POA: Diagnosis not present

## 2021-12-09 DIAGNOSIS — I152 Hypertension secondary to endocrine disorders: Secondary | ICD-10-CM

## 2021-12-09 DIAGNOSIS — K582 Mixed irritable bowel syndrome: Secondary | ICD-10-CM | POA: Diagnosis not present

## 2021-12-09 DIAGNOSIS — E119 Type 2 diabetes mellitus without complications: Secondary | ICD-10-CM

## 2021-12-09 DIAGNOSIS — E1142 Type 2 diabetes mellitus with diabetic polyneuropathy: Secondary | ICD-10-CM

## 2021-12-09 DIAGNOSIS — E1159 Type 2 diabetes mellitus with other circulatory complications: Secondary | ICD-10-CM | POA: Diagnosis not present

## 2021-12-09 DIAGNOSIS — E785 Hyperlipidemia, unspecified: Secondary | ICD-10-CM

## 2021-12-09 DIAGNOSIS — E1169 Type 2 diabetes mellitus with other specified complication: Secondary | ICD-10-CM

## 2021-12-09 DIAGNOSIS — K31A0 Gastric intestinal metaplasia, unspecified: Secondary | ICD-10-CM

## 2021-12-09 LAB — POCT GLYCOSYLATED HEMOGLOBIN (HGB A1C): Hemoglobin A1C: 7.8 % — AB (ref 4.0–5.6)

## 2021-12-09 NOTE — Assessment & Plan Note (Signed)
Likely secondary to uncontrolled dm  Will monitor

## 2021-12-09 NOTE — Progress Notes (Signed)
Gastroenterology Consultation  Referring Provider:     Alfredia Ferguson, PA-C Primary Care Physician:  Alfredia Ferguson, PA-C Primary Gastroenterologist:  Dr. Servando Snare     Reason for Consultation:     History of gastric intestinal metaplasia        HPI:   Phillip Robles is a 56 y.o. y/o male referred for consultation & management of history of gastric intestine metaplasia by Dr. Alfredia Ferguson, PA-C.  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  The patient also had a capsule endoscopy with a small AVM.  These were done for anemia.  The patient has a history of coronary artery disease with a bypass surgery in his past.  The patient also states that he has alternating diarrhea and constipation with bloating and has tried to modify his diet in the past without success.  The patient denies any black stools or bloody stools and reports that his last colonoscopy was concluded with a recommendation for a repeat colonoscopy in 5 years. The patient was also recommended to have a repeat upper endoscopy due to his gastrointestinal metaplasia.  Past Medical History:  Diagnosis Date   Anemia    Blood transfusion without reported diagnosis    Chronic gastritis w/ Metaplasia    Diabetes mellitus without complication (HCC)    Hypertension    Myocardial infarction (HCC)     No past surgical history on file.  Prior to Admission medications   Medication Sig Start Date End Date Taking? Authorizing Provider  aspirin EC 81 MG tablet Take 81 mg by mouth daily. Swallow whole.   Yes [provider]  atorvastatin (LIPITOR) 80 MG tablet Take 80 mg by mouth daily. 07/06/21  Yes [provider]  dorzolamide (TRUSOPT) 2 % ophthalmic solution SMARTSIG:In Eye(s) 03/17/21  Yes [provider]  ENTRESTO 24-26 MG Take 1 tablet by mouth 2 (two) times daily. 08/14/21  Yes [provider]  ezetimibe (ZETIA) 10 MG tablet Take 10 mg by mouth daily. 07/06/21  Yes [provider]  ibuprofen (ADVIL) 600 MG tablet Take 600 mg by mouth 3 (three) times daily. 10/29/21  Yes [provider]  JARDIANCE 25 MG TABS tablet Take 25 mg by mouth daily. 08/14/21  Yes [provider]  metFORMIN (GLUCOPHAGE) 1000 MG tablet Take 1,000 mg by mouth 2 (two) times daily. 08/14/21  Yes [provider]  metoprolol succinate (TOPROL-XL) 25 MG 24 hr tablet Take 25 mg by mouth 2 (two) times daily. 05/15/21  Yes [provider]  ondansetron (ZOFRAN-ODT) 4 MG disintegrating tablet Take one tablet po q 12 prn nausea. 10/29/21  Yes [provider]  timolol (BETIMOL) 0.25 % ophthalmic solution    Yes [provider]  timolol (TIMOPTIC) 0.5 % ophthalmic solution SMARTSIG:In Eye(s) 04/11/21  Yes [provider]  VICTOZA 18 MG/3ML SOPN Inject 1.8 mg into the skin daily. 05/18/21  Yes [provider]    Family History  Problem Relation Age of Onset   Heart Problems Mother    Heart Problems Father    Bipolar disorder Sister    COPD Maternal Uncle    Heart Problems Paternal Uncle    Cancer Maternal Grandmother    Heart Problems Paternal Grandmother    Heart Problems Paternal Grandfather      Social History   Tobacco Use   Smoking status: Never   Smokeless tobacco: Former  Quit date: 03/2019  Substance Use Topics   Alcohol use: Not Currently    Comment: Quit 07/2019   Drug use: Never    Allergies as of 12/09/2021 - Review Complete 12/09/2021  Allergen Reaction Noted   Other  09/03/2021    Review of Systems:    All systems reviewed and negative except where noted in HPI.   Physical Exam:  BP 125/75   Pulse 96   Temp 98.6 F (37 C) (Oral)   Ht 6\' 4"  (1.93 m)   Wt 234 lb (106.1 kg)   BMI 28.48 kg/m  No LMP for male patient. General:   Alert,  Well-developed, well-nourished, pleasant and cooperative in  NAD Head:  Normocephalic and atraumatic. Eyes:  Sclera clear, no icterus.   Conjunctiva pink. Ears:  Normal auditory acuity. Neck:  Supple; no masses or thyromegaly. Lungs:  Respirations even and unlabored.  Clear throughout to auscultation.   No wheezes, crackles, or rhonchi. No acute distress. Heart:  Regular rate and rhythm; no murmurs, clicks, rubs, or gallops. Abdomen:  Normal bowel sounds.  No bruits.  Soft, non-tender and non-distended without masses, hepatosplenomegaly or hernias noted.  No guarding or rebound tenderness.  Negative Carnett sign.   Rectal:  Deferred.  Pulses:  Normal pulses noted. Extremities:  No clubbing or edema.  No cyanosis. Neurologic:  Alert and oriented x3;  grossly normal neurologically. Skin:  Intact without significant lesions or rashes.  No jaundice. Lymph Nodes:  No significant cervical adenopathy. Psych:  Alert and cooperative. Normal mood and affect.  Imaging Studies: No results found.  Assessment and Plan:   Phillip Robles is a 56 y.o. y/o male who comes in today with a history of alternating diarrhea constipation with bloating and meets the criteria for IBS-M.  The patient has been told about a low FODMAP diet and the data on tricyclic antidepressants for the treatment of irritable bowel syndrome.  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  The patient has been explained the plan agrees with it.    59, MD. Midge Minium    Note: This dictation was prepared with Dragon dictation along with smaller phrase technology. Any transcriptional errors that result from this process are unintentional.

## 2021-12-09 NOTE — Assessment & Plan Note (Addendum)
A1c up to 7.8% from 7.4% ; uncontrolled  Long conversation regarding diet and exercise. Pt currently managed w/ metformin 1000 mg BID, jardiance 25 mg, Victoza 1.8 mg daily. Next option would be adding another medication Discussed referral to nutritionist, pt declined On statin Foot exam utd uacr today F/u 3 mo

## 2021-12-09 NOTE — Assessment & Plan Note (Signed)
At goal Continue atorvastatin 80 and zetia 10 mg

## 2021-12-09 NOTE — Assessment & Plan Note (Signed)
Elevated in office today but pt states he did not take his meds. Advised him to be consistent and check at home. Ordered cmp F/u 25mo

## 2021-12-11 LAB — MICROALBUMIN / CREATININE URINE RATIO
Creatinine, Urine: 51.2 mg/dL
Microalb/Creat Ratio: <6 mg/g creat (ref 0–29)
Microalbumin, Urine: <3 ug/mL

## 2021-12-11 LAB — COMPREHENSIVE METABOLIC PANEL

## 2021-12-14 DIAGNOSIS — S5332XD Traumatic rupture of left ulnar collateral ligament, subsequent encounter: Secondary | ICD-10-CM | POA: Diagnosis not present

## 2021-12-21 DIAGNOSIS — E113313 Type 2 diabetes mellitus with moderate nonproliferative diabetic retinopathy with macular edema, bilateral: Secondary | ICD-10-CM | POA: Diagnosis not present

## 2021-12-21 LAB — HM DIABETES EYE EXAM

## 2021-12-22 DIAGNOSIS — S5332XD Traumatic rupture of left ulnar collateral ligament, subsequent encounter: Secondary | ICD-10-CM | POA: Diagnosis not present

## 2021-12-29 DIAGNOSIS — S5332XD Traumatic rupture of left ulnar collateral ligament, subsequent encounter: Secondary | ICD-10-CM | POA: Diagnosis not present

## 2022-01-01 ENCOUNTER — Ambulatory Visit (INDEPENDENT_AMBULATORY_CARE_PROVIDER_SITE_OTHER): Payer: BC Managed Care – PPO | Admitting: Cardiology

## 2022-01-01 ENCOUNTER — Encounter: Payer: Self-pay | Admitting: Cardiology

## 2022-01-01 VITALS — BP 110/60 | HR 92 | Ht 76.0 in | Wt 228.0 lb

## 2022-01-01 DIAGNOSIS — I10 Essential (primary) hypertension: Secondary | ICD-10-CM | POA: Diagnosis not present

## 2022-01-01 DIAGNOSIS — Z951 Presence of aortocoronary bypass graft: Secondary | ICD-10-CM

## 2022-01-01 DIAGNOSIS — E7800 Pure hypercholesterolemia, unspecified: Secondary | ICD-10-CM

## 2022-01-01 NOTE — Progress Notes (Signed)
Cardiology Office Note:    Date:  01/01/2022   ID:  Sharion Settler, DOB Jun 05, 1965, MRN [id number]  PCP:  Alfredia Ferguson, PA-C   [facility] Providers Cardiologist:  Debbe Odea, MD     Referring MD: Alfredia Ferguson, PA-C   Chief Complaint  Patient presents with   Other    3 month follow up. Meds reviewed verbally with patient.      History of Present Illness:    Phillip Robles is a 56 y.o. male with a hx of CAD (prior PCI 2007, 2009), MI s/p CABG x 5 2009, hypertension, hyperlipidemia, diabetes who presents for follow-up.  Previously seen to establish care due to history of CAD.  Echocardiogram was obtained to evaluate systolic function.  States having history of fatigue with taking beta-blockers, symptoms were worse with taking atenolol and carvedilol in the past.  Currently on Toprol-XL 25 mg daily, able to work for about 6 hours before getting tired.  He walks about 3 miles a day, denies chest pain or shortness of breath.  Would like to exercise more.  He otherwise feels well, denies chest pain or shortness of breath, compliant with medications.  Prior notes Patient moved to the area from [facility].  Had an MI in 2007 requiring stents.  2 years later he had additional stent placements.  In December 2009, due to persistent symptoms of chest discomfort, he underwent left heart cath showing multivessel disease requiring CABG.   left heart cath in 2011 and was told everything was okay.     Past Medical History:  Diagnosis Date   Anemia    Blood transfusion without reported diagnosis    Chronic gastritis w/ Metaplasia    Diabetes mellitus without complication (HCC)    Hypertension    Myocardial infarction [facility])     History reviewed. No pertinent surgical history.  Current Medications: Current Meds  Medication Sig   aspirin EC 81 MG tablet Take 81 mg by mouth daily. Swallow whole.   atorvastatin (LIPITOR) 80 MG tablet Take 80 mg by mouth daily.    dorzolamide (TRUSOPT) 2 % ophthalmic solution SMARTSIG:In Eye(s)   ENTRESTO 24-26 MG Take 1 tablet by mouth 2 (two) times daily.   ezetimibe (ZETIA) 10 MG tablet Take 10 mg by mouth daily.   ibuprofen (ADVIL) 600 MG tablet Take 600 mg by mouth 3 (three) times daily.   JARDIANCE 25 MG TABS tablet Take 25 mg by mouth daily.   metFORMIN (GLUCOPHAGE) 1000 MG tablet Take 1,000 mg by mouth 2 (two) times daily.   metoprolol succinate (TOPROL-XL) 25 MG 24 hr tablet Take 25 mg by mouth 2 (two) times daily.   ondansetron (ZOFRAN-ODT) 4 MG disintegrating tablet Take one tablet po q 12 prn nausea.   timolol (BETIMOL) 0.25 % ophthalmic solution    timolol (TIMOPTIC) 0.5 % ophthalmic solution SMARTSIG:In Eye(s)   VICTOZA 18 MG/3ML SOPN Inject 1.8 mg into the skin daily.     Allergies:   Other   Social History   Socioeconomic History   Marital status: Married    Spouse name: Not on file   Number of children: Not on file   Years of education: Not on file   Highest education level: Not on file  Occupational History   Not on file  Tobacco Use   Smoking status: Never   Smokeless tobacco: Former    Quit date: 03/2019  Substance and Sexual Activity   Alcohol use: Not Currently    Comment: Quit  07/2019   Drug use: Never   Sexual activity: Yes  Other Topics Concern   Not on file  Social History Narrative   Not on file   Social Determinants of Health   Financial Resource Strain: Not on file  Food Insecurity: Not on file  Transportation Needs: Not on file  Physical Activity: Not on file  Stress: Not on file  Social Connections: Not on file     Family History: The patient's family history includes Bipolar disorder in his sister; COPD in his maternal uncle; Cancer in his maternal grandmother; Heart Problems in his father, mother, paternal grandfather, paternal grandmother, and paternal uncle.  ROS:   Please see the history of present illness.     All other systems reviewed and are  negative.  EKGs/Labs/Other Studies Reviewed:    The following studies were reviewed today:   EKG:  EKG is  ordered today.  The ekg ordered today demonstrates normal sinus rhythm, low voltage QRS, possible old inferior infarct  Recent Labs: 09/03/2021: ALT 31; BUN 19; Creatinine, Ser 1.00; Hemoglobin 15.0; Platelets 144; Potassium 4.9; Sodium 143  Recent Lipid Panel    Component Value Date/Time   CHOL 116 09/03/2021 1114   TRIG 90 09/03/2021 1114   HDL 51 09/03/2021 1114   CHOLHDL 2.3 09/03/2021 1114   LDLCALC 48 09/03/2021 1114     Risk Assessment/Calculations:          Physical Exam:    VS:  BP 110/60 (BP Location: Left Arm, Patient Position: Sitting, Cuff Size: Normal)   Pulse 92   Ht 6\' 4"  (1.93 m)   Wt 228 lb (103.4 kg)   SpO2 99%   BMI 27.75 kg/m     Wt Readings from Last 3 Encounters:  01/01/22 228 lb (103.4 kg)  12/09/21 234 lb (106.1 kg)  12/09/21 235 lb 6.4 oz (106.8 kg)     GEN:  Well nourished, well developed in no acute distress HEENT: Normal NECK: No JVD; No carotid bruits CARDIAC: RRR, no murmurs, rubs, gallops RESPIRATORY:  Clear to auscultation without rales, wheezing or rhonchi  ABDOMEN: Soft, non-tender, non-distended MUSCULOSKELETAL:  No edema; No deformity  SKIN: Warm and dry NEUROLOGIC:  Alert and oriented x 3 PSYCHIATRIC:  Normal affect   ASSESSMENT:    1. Hx of CABG   2. Primary hypertension   3. Pure hypercholesterolemia     PLAN:    In order of problems listed above:  CAD/CABG x5.  Denies chest pain.  Echo with EF 45 to 50%.  Continue aspirin, Toprol-XL, Lipitor 80.  Obtain limited echo to evaluate EF.  Activity as tolerated advised. Hypertension, BP controlled.  Continue Toprol-XL, Entresto. Hyperlipidemia, cholesterol controlled, LDL at goal.  Continue Lipitor 80.  Follow-up in 6-8 months.      Medication Adjustments/Labs and Tests Ordered: Current medicines are reviewed at length with the patient today.  Concerns  regarding medicines are outlined above.  Orders Placed This Encounter  Procedures   EKG 12-Lead   ECHOCARDIOGRAM LIMITED   No orders of the defined types were placed in this encounter.   Patient Instructions  Medication Instructions:   Your physician recommends that you continue on your current medications as directed. Please refer to the Current Medication list given to you today.  *If you need a refill on your cardiac medications before your next appointment, please call your pharmacy*   Testing/Procedures:  Your physician has requested that you have an limited echocardiogram. Echocardiography is a painless test  that uses sound waves to create images of your heart. It provides your doctor with information about the size and shape of your heart and how well your heart's chambers and valves are working. This procedure takes approximately one hour. There are no restrictions for this procedure.  Follow-Up: At [facility], you and your health needs are our priority.  As part of our continuing mission to provide you with exceptional heart care, we have created designated Provider Care Teams.  These Care Teams include your primary Cardiologist (physician) and Advanced Practice Providers (APPs -  Physician Assistants and Nurse Practitioners) who all work together to provide you with the care you need, when you need it.  We recommend signing up for the patient portal called "MyChart".  Sign up information is provided on this After Visit Summary.  MyChart is used to connect with patients for Virtual Visits (Telemedicine).  Patients are able to view lab/test results, encounter notes, upcoming appointments, etc.  Non-urgent messages can be sent to your provider as well.   To learn more about what you can do with MyChart, go to [email or web address].    Your next appointment:   7-8 month(s)  The format for your next appointment:   In Person  Provider:   You may see Debbe Odea, MD  or one of the following Advanced Practice Providers on your designated Care Team:   Nicolasa Ducking, NP Eula Listen, PA-C Cadence Fransico Michael, [place]    Other Instructions   Important Information About Sugar         Signed, Debbe Odea, MD  01/01/2022 10:52 AM    [facility]

## 2022-01-01 NOTE — Patient Instructions (Signed)
Medication Instructions:   Your physician recommends that you continue on your current medications as directed. Please refer to the Current Medication list given to you today.  *If you need a refill on your cardiac medications before your next appointment, please call your pharmacy*   Testing/Procedures:  Your physician has requested that you have an limited echocardiogram. Echocardiography is a painless test that uses sound waves to create images of your heart. It provides your doctor with information about the size and shape of your heart and how well your heart's chambers and valves are working. This procedure takes approximately one hour. There are no restrictions for this procedure.  Follow-Up: At [facility], you and your health needs are our priority.  As part of our continuing mission to provide you with exceptional heart care, we have created designated Provider Care Teams.  These Care Teams include your primary Cardiologist (physician) and Advanced Practice Providers (APPs -  Physician Assistants and Nurse Practitioners) who all work together to provide you with the care you need, when you need it.  We recommend signing up for the patient portal called "MyChart".  Sign up information is provided on this After Visit Summary.  MyChart is used to connect with patients for Virtual Visits (Telemedicine).  Patients are able to view lab/test results, encounter notes, upcoming appointments, etc.  Non-urgent messages can be sent to your provider as well.   To learn more about what you can do with MyChart, go to [email or web address].    Your next appointment:   7-8 month(s)  The format for your next appointment:   In Person  Provider:   You may see Debbe Odea, MD or one of the following Advanced Practice Providers on your designated Care Team:   Nicolasa Ducking, NP Eula Listen, PA-C Cadence Fransico Michael, [place]    Other Instructions   Important Information About  Sugar

## 2022-01-04 ENCOUNTER — Telehealth: Payer: Self-pay | Admitting: Cardiology

## 2022-01-04 ENCOUNTER — Other Ambulatory Visit: Payer: Self-pay

## 2022-01-04 ENCOUNTER — Telehealth: Payer: Self-pay

## 2022-01-04 DIAGNOSIS — K31A0 Gastric intestinal metaplasia, unspecified: Secondary | ICD-10-CM

## 2022-01-04 NOTE — Telephone Encounter (Signed)
Dr. Azucena Cecil, you saw this patient on 01/01/22 who is now pending EGD. Could you please comment on cardiac clearance. Even though there is no request for him to hold aspirin, would you be agreeable for him to do so for 5-7 days prior to procedure if requested? Please send your response to p cv div preop  Thank you, Levi Aland, NP-C    01/04/2022, 2:18 PM [facility] [address], [address] Office [phone] Fax [phone]

## 2022-01-04 NOTE — Telephone Encounter (Signed)
Procedure clearance faxed to [facility]

## 2022-01-04 NOTE — Telephone Encounter (Signed)
   Pre-operative Risk Assessment    Patient Name: Phillip Robles  DOB: 1965/12/05 MRN: [id number]      Request for Surgical Clearance    Procedure:   EGD  Date of Surgery:  Clearance 02/16/22                               Surgeon:  DR Midge Minium Surgeon's Group or Practice Name:  [facility] Phone number:  [phone] Fax number:  [phone]  Type of Clearance Requested:   - Medical    Type of Anesthesia:  Not Indicated   Additional requests/questions:    Signed, Dalia Heading   01/04/2022, 11:59 AM

## 2022-01-05 DIAGNOSIS — S5332XD Traumatic rupture of left ulnar collateral ligament, subsequent encounter: Secondary | ICD-10-CM | POA: Diagnosis not present

## 2022-01-05 NOTE — Telephone Encounter (Signed)
   Primary Cardiologist: Debbe Odea, MD  Chart reviewed as part of pre-operative protocol coverage. Given past medical history and time since last visit, based on ACC/AHA guidelines, Phillip Robles would be at acceptable risk for the planned procedure without further cardiovascular testing.   Ideally aspirin should be continued without interruption, however if the bleeding risk is too great, aspirin may be held for 7 days prior to surgery. Please resume aspirin post operatively when it is felt to be safe from a bleeding standpoint.   I will route this recommendation to the requesting party via Epic fax function and remove from pre-op pool.  Please call with questions.  Levi Aland, NP-C    01/05/2022, 11:51 AM [facility] [address] Office [phone] Fax [phone]

## 2022-01-19 DIAGNOSIS — S5332XD Traumatic rupture of left ulnar collateral ligament, subsequent encounter: Secondary | ICD-10-CM | POA: Diagnosis not present

## 2022-01-21 ENCOUNTER — Ambulatory Visit: Payer: BC Managed Care – PPO | Admitting: Gastroenterology

## 2022-02-16 ENCOUNTER — Encounter
Admission: RE | Disposition: A | Discharge status: Home / Self Care | Payer: Self-pay | Source: Home / Self Care | Attending: Gastroenterology

## 2022-02-16 ENCOUNTER — Ambulatory Visit
Admission: RE | Admit: 2022-02-16 | Discharge: 2022-02-16 | Disposition: A | Discharge status: Home / Self Care | Payer: BC Managed Care – PPO | Attending: Gastroenterology | Admitting: Gastroenterology

## 2022-02-16 ENCOUNTER — Encounter: Payer: Self-pay | Admitting: Registered Nurse

## 2022-02-16 ENCOUNTER — Encounter: Payer: Self-pay | Admitting: Gastroenterology

## 2022-02-16 DIAGNOSIS — Z7985 Long-term (current) use of injectable non-insulin antidiabetic drugs: Secondary | ICD-10-CM | POA: Diagnosis not present

## 2022-02-16 DIAGNOSIS — K31A0 Gastric intestinal metaplasia, unspecified: Secondary | ICD-10-CM | POA: Diagnosis not present

## 2022-02-16 DIAGNOSIS — Z5309 Procedure and treatment not carried out because of other contraindication: Secondary | ICD-10-CM | POA: Insufficient documentation

## 2022-02-16 HISTORY — PX: ESOPHAGOGASTRODUODENOSCOPY (EGD) WITH PROPOFOL: SHX5813

## 2022-02-16 LAB — GLUCOSE, CAPILLARY: Glucose-Capillary: 148 mg/dL — ABNORMAL HIGH (ref 70–99)

## 2022-02-16 SURGERY — ESOPHAGOGASTRODUODENOSCOPY (EGD) WITH PROPOFOL
Anesthesia: General

## 2022-02-16 NOTE — Consult Note (Signed)
Patient reports frustration/unhappiness with communication about his procedure today as well as scheduling the process as he has needed to be rescheduled multiple times. Patient takes Victoza, last injection was yesterday, he states he was not informed that he needed to stop this medication 7 days prior to surgery. Discussed options of GETA vs rescheduling this procedure. Patient wishes to cancel.   Birdie Riddle, MD  Anesthesiology

## 2022-02-16 NOTE — H&P (Signed)
Lucilla Lame, MD [facility] [address], [place], [place] [address] Phone:[phone] Fax : [phone]  Primary Care Physician:  [name], PA-C Primary Gastroenterologist:  Dr. Allen Norris  Pre-Procedure History & Physical: HPI:  Phillip Robles is a 56 y.o. male is here for an endoscopy.   Past Medical History:  Diagnosis Date   Anemia    Blood transfusion without reported diagnosis    Chronic gastritis w/ Metaplasia    Diabetes mellitus without complication ([place])    Hypertension    Myocardial infarction ([place])     No past surgical history on file.  Prior to Admission medications   Medication Sig Start Date End Date Taking? Authorizing Provider  aspirin EC 81 MG tablet Take 81 mg by mouth daily. Swallow whole.   Yes [provider]  atorvastatin (LIPITOR) 80 MG tablet Take 80 mg by mouth daily. 07/06/21  Yes [provider]  dorzolamide (TRUSOPT) 2 % ophthalmic solution SMARTSIG:In Eye(s) 03/17/21  Yes [provider]  ENTRESTO 24-26 MG Take 1 tablet by mouth 2 (two) times daily. 08/14/21  Yes [provider]  ezetimibe (ZETIA) 10 MG tablet Take 10 mg by mouth daily. 07/06/21  Yes [provider]  JARDIANCE 25 MG TABS tablet Take 25 mg by mouth daily. 08/14/21  Yes [provider]  metFORMIN (GLUCOPHAGE) 1000 MG tablet Take 1,000 mg by mouth 2 (two) times daily. 08/14/21  Yes [provider]  metoprolol succinate (TOPROL-XL) 25 MG 24 hr tablet Take 25 mg by mouth 2 (two) times daily. 05/15/21  Yes [provider]  timolol (BETIMOL) 0.25 % ophthalmic solution    Yes [provider]  timolol (TIMOPTIC) 0.5 % ophthalmic solution SMARTSIG:In Eye(s) 04/11/21  Yes [provider]  VICTOZA 18 MG/3ML SOPN Inject 1.8 mg into the skin daily. 05/18/21  Yes [provider]  ibuprofen (ADVIL) 600 MG tablet Take 600 mg by mouth 3 (three) times daily. 10/29/21   [provider]   ondansetron (ZOFRAN-ODT) 4 MG disintegrating tablet Take one tablet po q 12 prn nausea. 10/29/21   [provider]    Allergies as of 01/04/2022 - Review Complete 01/01/2022  Allergen Reaction Noted   Other  09/03/2021    Family History  Problem Relation Age of Onset   Heart Problems Mother    Heart Problems Father    Bipolar disorder Sister    COPD Maternal Uncle    Heart Problems Paternal Uncle    Cancer Maternal Grandmother    Heart Problems Paternal Grandmother    Heart Problems Paternal Grandfather     Social History   Socioeconomic History   Marital status: Married    Spouse name: Not on file   Number of children: Not on file   Years of education: Not on file   Highest education level: Not on file  Occupational History   Not on file  Tobacco Use   Smoking status: Never   Smokeless tobacco: Former    Quit date: 03/2019  Substance and Sexual Activity   Alcohol use: Not Currently    Comment: Quit 07/2019   Drug use: Never   Sexual activity: Yes  Other Topics Concern   Not on file  Social History Narrative   Not on file   Social Determinants of Health   Financial Resource Strain: Not on file  Food Insecurity: Not on file  Transportation Needs: Not on file  Physical Activity: Not on file  Stress: Not on file  Social Connections: Not on file  Intimate Partner Violence: Not on file    Review of Systems: See HPI, otherwise negative ROS  Physical Exam: There were no vitals taken for this visit. General:   Alert,  pleasant and cooperative in NAD Head:  Normocephalic and atraumatic. Neck:  Supple; no masses or thyromegaly. Lungs:  Clear throughout to auscultation.    Heart:  Regular rate and rhythm. Abdomen:  Soft, nontender and nondistended. Normal bowel sounds, without guarding, and without rebound.   Neurologic:  Alert and  oriented x4;  grossly normal neurologically.  Impression/Plan: Phillip Robles is here for an endoscopy to be performed  for gastric intestinal metaplasia  Risks, benefits, limitations, and alternatives regarding  endoscopy have been reviewed with the patient.  Questions have been answered.  All parties agreeable.   Lucilla Lame, MD  02/16/2022, 10:41 AM

## 2022-02-17 ENCOUNTER — Encounter: Payer: Self-pay | Admitting: Gastroenterology

## 2022-02-19 ENCOUNTER — Encounter: Payer: Self-pay | Admitting: Gastroenterology

## 2022-03-10 NOTE — Progress Notes (Signed)
Phillip Robles,acting as a scribe for Phillip Kirschner, PA-C.,have documented all relevant documentation on the behalf of Phillip Kirschner, PA-C,as directed by  Phillip Kirschner, PA-C while in the presence of Phillip Kirschner, PA-C.    Established patient visit   Patient: Phillip Robles   DOB: Feb 25, 1966   56 y.o. Male  MRN: [id number] Visit Date: 03/11/2022  Today's healthcare provider: [name], PA-C   Cc. Dm f/u, htn f/u  Subjective    HPI   Pt had a poor experience w/ [facility], would like another referral to another office.  Diabetes Mellitus Type II, Follow-up  Lab Results  Component Value Date   HGBA1C 6.9 (A) 03/11/2022   HGBA1C 7.8 (A) 12/09/2021   HGBA1C 7.4 (H) 09/03/2021   Wt Readings from Last 3 Encounters:  03/11/22 233 lb (105.7 kg)  01/01/22 228 lb (103.4 kg)  12/09/21 234 lb (106.1 kg)   Last seen for diabetes 3 months ago.  Management since then includes making no changes. He reports good compliance with treatment. He is having side effects. Possible GI side effects to Victoza.  Patient stopped it 2 weeks ago.   Symptoms: No fatigue No foot ulcerations  Yes appetite changes Yes nausea  Yes paresthesia of the feet  No polydipsia  No polyuria No visual disturbances   Yes vomiting     Home blood sugar records:  not being checked  Episodes of hypoglycemia? No    Current insulin regiment: none Most Recent Eye Exam: within the last year Current exercise: yard work Current diet habits: well balanced  Pertinent Labs: Lab Results  Component Value Date   CHOL 116 09/03/2021   HDL 51 09/03/2021   LDLCALC 48 09/03/2021   TRIG 90 09/03/2021   CHOLHDL 2.3 09/03/2021   Lab Results  Component Value Date   NA 143 09/03/2021   K 4.9 09/03/2021   CREATININE 1.00 09/03/2021   EGFR 88 09/03/2021   LABMICR <3.0 12/09/2021     --------------------------------------------------------------------------------------------------- Hypertension,  follow-up  BP Readings from Last 3 Encounters:  03/11/22 122/82  01/01/22 110/60  12/09/21 125/75   Wt Readings from Last 3 Encounters:  03/11/22 233 lb (105.7 kg)  01/01/22 228 lb (103.4 kg)  12/09/21 234 lb (106.1 kg)     He was last seen for hypertension 3 months ago.  BP at that visit was as above. Management since that visit includes making no changes but asked patient to check readings at home and bring record with him. Marland Kitchen  He reports good compliance with treatment. He is not having side effects.  He is following a Regular diet. He is exercising. He does not smoke.  Use of agents associated with hypertension: none.   Outside blood pressures are not being checked. Symptoms: No chest pain No chest pressure  No palpitations No syncope  No dyspnea No orthopnea  No paroxysmal nocturnal dyspnea No lower extremity edema   Pertinent labs Lab Results  Component Value Date   CHOL 116 09/03/2021   HDL 51 09/03/2021   LDLCALC 48 09/03/2021   TRIG 90 09/03/2021   CHOLHDL 2.3 09/03/2021   Lab Results  Component Value Date   NA 143 09/03/2021   K 4.9 09/03/2021   CREATININE 1.00 09/03/2021   EGFR 88 09/03/2021   GLUCOSE CANCELED 12/09/2021     The ASCVD Risk score (Arnett DK, et al., 2019) failed to calculate for the following reasons:   The patient has a prior MI or stroke  diagnosis  ---------------------------------------------------------------------------------------------------  Pt reports a dry patch on his nose for a few months, does not go away. Denies bleeding.   Medications: Outpatient Medications Prior to Visit  Medication Sig   aspirin EC 81 MG tablet Take 81 mg by mouth daily. Swallow whole.   timolol (BETIMOL) 0.25 % ophthalmic solution    timolol (TIMOPTIC) 0.5 % ophthalmic solution SMARTSIG:In Eye(s)   [DISCONTINUED] atorvastatin (LIPITOR) 80 MG tablet Take 80 mg by mouth daily.   [DISCONTINUED] ENTRESTO 24-26 MG Take 1 tablet by mouth 2 (two) times  daily.   [DISCONTINUED] ezetimibe (ZETIA) 10 MG tablet Take 10 mg by mouth daily.   [DISCONTINUED] JARDIANCE 25 MG TABS tablet Take 25 mg by mouth daily.   [DISCONTINUED] metFORMIN (GLUCOPHAGE) 1000 MG tablet Take 1,000 mg by mouth 2 (two) times daily.   [DISCONTINUED] metoprolol succinate (TOPROL-XL) 25 MG 24 hr tablet Take 25 mg by mouth 2 (two) times daily.   [DISCONTINUED] VICTOZA 18 MG/3ML SOPN Inject 1.8 mg into the skin daily.   [DISCONTINUED] dorzolamide (TRUSOPT) 2 % ophthalmic solution SMARTSIG:In Eye(s)   [DISCONTINUED] ibuprofen (ADVIL) 600 MG tablet Take 600 mg by mouth 3 (three) times daily.   [DISCONTINUED] ondansetron (ZOFRAN-ODT) 4 MG disintegrating tablet Take one tablet po q 12 prn nausea.   No facility-administered medications prior to visit.    Review of Systems  Constitutional:  Negative for fatigue and fever.  Respiratory:  Negative for cough and shortness of breath.   Cardiovascular:  Negative for chest pain, palpitations and leg swelling.  Gastrointestinal:  Positive for abdominal distention, constipation and nausea.  Musculoskeletal:  Positive for arthralgias.  Neurological:  Negative for dizziness and headaches.      Objective    BP 122/82 (BP Location: Right Arm, Patient Position: Sitting, Cuff Size: Normal)   Pulse 91   Temp 97.8 F (36.6 C) (Oral)   Wt 233 lb (105.7 kg)   SpO2 100%   BMI 28.36 kg/m   Physical Exam Constitutional:      General: He is awake.     Appearance: He is well-developed.  HENT:     Head: Normocephalic.  Eyes:     Conjunctiva/sclera: Conjunctivae normal.  Cardiovascular:     Rate and Rhythm: Normal rate and regular rhythm.     Heart sounds: Normal heart sounds.  Pulmonary:     Effort: Pulmonary effort is normal.     Breath sounds: Normal breath sounds.  Skin:    General: Skin is warm.     Comments: Bridge of nose with a 5-6 mm patch of dry skin, no scabbing, bleeding  Neurological:     Mental Status: He is alert  and oriented to person, place, and time.  Psychiatric:        Attention and Perception: Attention normal.        Mood and Affect: Mood normal.        Speech: Speech normal.        Behavior: Behavior is cooperative.      Results for orders placed or performed in visit on 03/11/22  POCT glycosylated hemoglobin (Hb A1C)  Result Value Ref Range   Hemoglobin A1C 6.9 (A) 4.0 - 5.6 %   HbA1c POC (<> result, manual entry)     HbA1c, POC (prediabetic range)     HbA1c, POC (controlled diabetic range)      Assessment & Plan     Problem List Items Addressed This Visit       Cardiovascular  and Mediastinum   Hypertension associated with diabetes ([place])    Chronic, Controlled in office, ordered new cmp, continue medications F/u 6 mo      Relevant Medications   metFORMIN (GLUCOPHAGE) 1000 MG tablet   JARDIANCE 25 MG TABS tablet   atorvastatin (LIPITOR) 80 MG tablet   ENTRESTO 24-26 MG   ezetimibe (ZETIA) 10 MG tablet   metoprolol succinate (TOPROL-XL) 25 MG 24 hr tablet     Digestive   Gastric intestinal metaplasia    Referred to another GI office for repeat endoscopy      Relevant Orders   Ambulatory referral to Gastroenterology     Endocrine   Type 2 diabetes mellitus with diabetic polyneuropathy, without long-term current use of insulin (HCC) - Primary    A1c today 6.9%  previously 7.8% Pt manages with metformin 1000 mg bid, jardiance 25 mg and victoza 1.8 mg --pt stopped victoza 2/2 GI SE  For now stay off and monitor glucose, given freestyle libre sample, pt has used before. Sent in rx we will attempt to get coverage vs coupon On statin, on ace-Iarb (entresto) Foot exam, optho, uacr utd F/u 3-4 mo       Relevant Medications   Continuous Blood Gluc Sensor (FREESTYLE LIBRE 2 SENSOR) MISC   metFORMIN (GLUCOPHAGE) 1000 MG tablet   JARDIANCE 25 MG TABS tablet   atorvastatin (LIPITOR) 80 MG tablet   ezetimibe (ZETIA) 10 MG tablet   Other Relevant Orders   POCT  glycosylated hemoglobin (Hb A1C) (Completed)   Hyperlipidemia associated with type 2 diabetes mellitus (HCC)    Pulled for refill ,at goal recheck annually Continue atorvastatin 80 mg and zetia 10 mg       Relevant Medications   metFORMIN (GLUCOPHAGE) 1000 MG tablet   JARDIANCE 25 MG TABS tablet   atorvastatin (LIPITOR) 80 MG tablet   ENTRESTO 24-26 MG   ezetimibe (ZETIA) 10 MG tablet   metoprolol succinate (TOPROL-XL) 25 MG 24 hr tablet     Musculoskeletal and Integument   Skin lesion    Bridge of nose, advised a trial of topical steroid x 10 days if no improvement call/message office would refer to derm       Relevant Medications   triamcinolone cream (KENALOG) 0.1 %     Other   History of MI (myocardial infarction)    F/b cardiology sent in refills for now but encouraged pt to get from cardiology      Relevant Medications   ENTRESTO 24-26 MG   metoprolol succinate (TOPROL-XL) 25 MG 24 hr tablet   Elevated alkaline phosphatase level    May be 2/2 fatty liver vs hyperglycemia Repeat cmp w/ ggt      Relevant Orders   Comprehensive Metabolic Panel (CMET)   Gamma GT   Other Visit Diagnoses     Thrombocytopenia ([place])       Relevant Orders   CBC w/Diff/Platelet   Need for shingles vaccine       Relevant Orders   Zoster Recombinant (Shingrix ) (Completed)       Return in about 4 months (around 07/12/2022) for DMII.      I, Phillip Kirschner, PA-C have reviewed all documentation for this visit. The documentation on  03/11/2022  for the exam, diagnosis, procedures, and orders are all accurate and complete.  Phillip Kirschner, PA-C [facility] [address] [place], [place], [address] Office: [phone] Fax: [place]

## 2022-03-11 ENCOUNTER — Ambulatory Visit (INDEPENDENT_AMBULATORY_CARE_PROVIDER_SITE_OTHER): Payer: BC Managed Care – PPO | Admitting: Physician Assistant

## 2022-03-11 ENCOUNTER — Encounter: Payer: Self-pay | Admitting: Physician Assistant

## 2022-03-11 VITALS — BP 122/82 | HR 91 | Temp 97.8°F | Wt 233.0 lb

## 2022-03-11 DIAGNOSIS — D696 Thrombocytopenia, unspecified: Secondary | ICD-10-CM | POA: Diagnosis not present

## 2022-03-11 DIAGNOSIS — R748 Abnormal levels of other serum enzymes: Secondary | ICD-10-CM | POA: Diagnosis not present

## 2022-03-11 DIAGNOSIS — E1169 Type 2 diabetes mellitus with other specified complication: Secondary | ICD-10-CM | POA: Diagnosis not present

## 2022-03-11 DIAGNOSIS — E1159 Type 2 diabetes mellitus with other circulatory complications: Secondary | ICD-10-CM

## 2022-03-11 DIAGNOSIS — L989 Disorder of the skin and subcutaneous tissue, unspecified: Secondary | ICD-10-CM

## 2022-03-11 DIAGNOSIS — E1142 Type 2 diabetes mellitus with diabetic polyneuropathy: Secondary | ICD-10-CM

## 2022-03-11 DIAGNOSIS — I252 Old myocardial infarction: Secondary | ICD-10-CM

## 2022-03-11 DIAGNOSIS — I152 Hypertension secondary to endocrine disorders: Secondary | ICD-10-CM

## 2022-03-11 DIAGNOSIS — E785 Hyperlipidemia, unspecified: Secondary | ICD-10-CM

## 2022-03-11 DIAGNOSIS — K31A0 Gastric intestinal metaplasia, unspecified: Secondary | ICD-10-CM

## 2022-03-11 DIAGNOSIS — Z23 Encounter for immunization: Secondary | ICD-10-CM | POA: Diagnosis not present

## 2022-03-11 LAB — POCT GLYCOSYLATED HEMOGLOBIN (HGB A1C): Hemoglobin A1C: 6.9 % — AB (ref 4.0–5.6)

## 2022-03-11 MED ORDER — METFORMIN HCL 1000 MG PO TABS: 1000.0000 mg | ORAL_TABLET | Freq: Two times a day (BID) | ORAL | 1 refills | Status: DC

## 2022-03-11 MED ORDER — METOPROLOL SUCCINATE ER 25 MG PO TB24: 25.0000 mg | ORAL_TABLET | Freq: Two times a day (BID) | ORAL | 0 refills | Status: DC

## 2022-03-11 MED ORDER — EZETIMIBE 10 MG PO TABS: 10.0000 mg | ORAL_TABLET | Freq: Every day | ORAL | 1 refills | Status: DC

## 2022-03-11 MED ORDER — JARDIANCE 25 MG PO TABS: 25.0000 mg | ORAL_TABLET | Freq: Every day | ORAL | 1 refills | Status: DC

## 2022-03-11 MED ORDER — FREESTYLE LIBRE 2 SENSOR MISC: 3 refills | Status: DC

## 2022-03-11 MED ORDER — ENTRESTO 24-26 MG PO TABS: 1.0000 | ORAL_TABLET | Freq: Two times a day (BID) | ORAL | 0 refills | Status: DC

## 2022-03-11 MED ORDER — ATORVASTATIN CALCIUM 80 MG PO TABS: 80.0000 mg | ORAL_TABLET | Freq: Every day | ORAL | 1 refills | Status: DC

## 2022-03-11 MED ORDER — TRIAMCINOLONE ACETONIDE 0.1 % EX CREA: 1.0000 | TOPICAL_CREAM | Freq: Two times a day (BID) | CUTANEOUS | 0 refills | Status: AC

## 2022-03-11 NOTE — Assessment & Plan Note (Signed)
Bridge of nose, advised a trial of topical steroid x 10 days if no improvement call/message office would refer to derm

## 2022-03-11 NOTE — Assessment & Plan Note (Signed)
Referred to another GI office for repeat endoscopy

## 2022-03-11 NOTE — Assessment & Plan Note (Signed)
Chronic, Controlled in office, ordered new cmp, continue medications F/u 6 mo

## 2022-03-11 NOTE — Assessment & Plan Note (Signed)
F/b cardiology sent in refills for now but encouraged pt to get from cardiology

## 2022-03-11 NOTE — Assessment & Plan Note (Signed)
May be 2/2 fatty liver vs hyperglycemia Repeat cmp w/ ggt

## 2022-03-11 NOTE — Assessment & Plan Note (Signed)
Pulled for refill ,at goal recheck annually Continue atorvastatin 80 mg and zetia 10 mg

## 2022-03-11 NOTE — Assessment & Plan Note (Addendum)
A1c today 6.9%  previously 7.8% Pt manages with metformin 1000 mg bid, jardiance 25 mg and victoza 1.8 mg --pt stopped victoza 2/2 GI SE  For now stay off and monitor glucose, given freestyle libre sample, pt has used before. Sent in rx we will attempt to get coverage vs coupon On statin, on ace-Iarb (entresto) Foot exam, optho, uacr utd F/u 3-4 mo

## 2022-03-12 ENCOUNTER — Other Ambulatory Visit: Payer: Self-pay | Admitting: Physician Assistant

## 2022-03-12 DIAGNOSIS — R748 Abnormal levels of other serum enzymes: Secondary | ICD-10-CM

## 2022-03-12 LAB — CBC WITH DIFFERENTIAL/PLATELET
Basophils Absolute: 0.1 10*3/uL (ref 0.0–0.2)
Basos: 1 %
EOS (ABSOLUTE): 0.1 10*3/uL (ref 0.0–0.4)
Eos: 3 %
Hematocrit: 42 % (ref 37.5–51.0)
Hemoglobin: 14.1 g/dL (ref 13.0–17.7)
Immature Grans (Abs): 0 10*3/uL (ref 0.0–0.1)
Immature Granulocytes: 0 %
Lymphocytes Absolute: 0.8 10*3/uL (ref 0.7–3.1)
Lymphs: 13 %
MCH: 30.5 pg (ref 26.6–33.0)
MCHC: 33.6 g/dL (ref 31.5–35.7)
MCV: 91 fL (ref 79–97)
Monocytes Absolute: 0.4 10*3/uL (ref 0.1–0.9)
Monocytes: 7 %
Neutrophils Absolute: 4.3 10*3/uL (ref 1.4–7.0)
Neutrophils: 76 %
Platelets: 117 10*3/uL — ABNORMAL LOW (ref 150–450)
RBC: 4.62 x10E6/uL (ref 4.14–5.80)
RDW: 12.3 % (ref 11.6–15.4)
WBC: 5.7 10*3/uL (ref 3.4–10.8)

## 2022-03-12 LAB — COMPREHENSIVE METABOLIC PANEL
ALT: 43 IU/L (ref 0–44)
AST: 40 IU/L (ref 0–40)
Albumin/Globulin Ratio: 2.3 — ABNORMAL HIGH (ref 1.2–2.2)
Albumin: 4.6 g/dL (ref 3.8–4.9)
Alkaline Phosphatase: 192 IU/L — ABNORMAL HIGH (ref 44–121)
BUN/Creatinine Ratio: 15 (ref 9–20)
BUN: 15 mg/dL (ref 6–24)
Bilirubin Total: 1.7 mg/dL — ABNORMAL HIGH (ref 0.0–1.2)
CO2: 25 mmol/L (ref 20–29)
Calcium: 10.1 mg/dL (ref 8.7–10.2)
Chloride: 102 mmol/L (ref 96–106)
Creatinine, Ser: 1.03 mg/dL (ref 0.76–1.27)
Globulin, Total: 2 g/dL (ref 1.5–4.5)
Glucose: 148 mg/dL — ABNORMAL HIGH (ref 70–99)
Potassium: 4.4 mmol/L (ref 3.5–5.2)
Sodium: 140 mmol/L (ref 134–144)
Total Protein: 6.6 g/dL (ref 6.0–8.5)
eGFR: 85 mL/min/{1.73_m2} (ref >59)

## 2022-03-12 LAB — GAMMA GT: GGT: 104 IU/L — ABNORMAL HIGH (ref 0–65)

## 2022-03-26 ENCOUNTER — Ambulatory Visit
Admission: RE | Admit: 2022-03-26 | Discharge: 2022-03-26 | Disposition: A | Discharge status: Home / Self Care | Payer: BC Managed Care – PPO | Source: Ambulatory Visit | Attending: Physician Assistant | Admitting: Physician Assistant

## 2022-03-26 DIAGNOSIS — R945 Abnormal results of liver function studies: Secondary | ICD-10-CM | POA: Diagnosis not present

## 2022-03-26 DIAGNOSIS — R748 Abnormal levels of other serum enzymes: Secondary | ICD-10-CM | POA: Diagnosis not present

## 2022-03-29 DIAGNOSIS — E113313 Type 2 diabetes mellitus with moderate nonproliferative diabetic retinopathy with macular edema, bilateral: Secondary | ICD-10-CM | POA: Diagnosis not present

## 2022-06-22 DIAGNOSIS — K219 Gastro-esophageal reflux disease without esophagitis: Secondary | ICD-10-CM | POA: Diagnosis not present

## 2022-06-22 DIAGNOSIS — R932 Abnormal findings on diagnostic imaging of liver and biliary tract: Secondary | ICD-10-CM | POA: Diagnosis not present

## 2022-06-22 DIAGNOSIS — R7989 Other specified abnormal findings of blood chemistry: Secondary | ICD-10-CM | POA: Diagnosis not present

## 2022-06-29 ENCOUNTER — Other Ambulatory Visit: Payer: Self-pay | Admitting: Gastroenterology

## 2022-06-29 DIAGNOSIS — R7989 Other specified abnormal findings of blood chemistry: Secondary | ICD-10-CM

## 2022-07-06 ENCOUNTER — Ambulatory Visit: Payer: BC Managed Care – PPO | Attending: Cardiology

## 2022-07-06 DIAGNOSIS — Z951 Presence of aortocoronary bypass graft: Secondary | ICD-10-CM | POA: Diagnosis not present

## 2022-07-06 MED ORDER — PERFLUTREN LIPID MICROSPHERE
1.0000 mL | INTRAVENOUS | Status: AC | PRN
  Administered 2022-07-06: 2 mL via INTRAVENOUS

## 2022-07-07 ENCOUNTER — Ambulatory Visit
Admission: RE | Admit: 2022-07-07 | Discharge: 2022-07-07 | Disposition: A | Discharge status: Home / Self Care | Payer: BC Managed Care – PPO | Source: Ambulatory Visit | Attending: Gastroenterology | Admitting: Gastroenterology

## 2022-07-07 DIAGNOSIS — K760 Fatty (change of) liver, not elsewhere classified: Secondary | ICD-10-CM | POA: Diagnosis not present

## 2022-07-07 DIAGNOSIS — R7989 Other specified abnormal findings of blood chemistry: Secondary | ICD-10-CM | POA: Diagnosis not present

## 2022-07-07 LAB — ECHOCARDIOGRAM LIMITED: Area-P 1/2: 3.99 cm2

## 2022-07-12 ENCOUNTER — Ambulatory Visit (INDEPENDENT_AMBULATORY_CARE_PROVIDER_SITE_OTHER): Payer: BC Managed Care – PPO | Admitting: Physician Assistant

## 2022-07-12 ENCOUNTER — Ambulatory Visit
Admission: RE | Admit: 2022-07-12 | Discharge: 2022-07-12 | Disposition: A | Discharge status: Home / Self Care | Payer: BC Managed Care – PPO | Attending: Physician Assistant | Admitting: Physician Assistant

## 2022-07-12 ENCOUNTER — Encounter: Payer: Self-pay | Admitting: Physician Assistant

## 2022-07-12 ENCOUNTER — Ambulatory Visit
Admission: RE | Admit: 2022-07-12 | Discharge: 2022-07-12 | Disposition: A | Discharge status: Home / Self Care | Payer: BC Managed Care – PPO | Source: Ambulatory Visit | Attending: Physician Assistant | Admitting: Physician Assistant

## 2022-07-12 VITALS — BP 124/89 | HR 86 | Temp 97.9°F | Ht 76.0 in | Wt 238.0 lb

## 2022-07-12 DIAGNOSIS — M79671 Pain in right foot: Secondary | ICD-10-CM | POA: Insufficient documentation

## 2022-07-12 DIAGNOSIS — E1142 Type 2 diabetes mellitus with diabetic polyneuropathy: Secondary | ICD-10-CM

## 2022-07-12 DIAGNOSIS — M7989 Other specified soft tissue disorders: Secondary | ICD-10-CM | POA: Diagnosis not present

## 2022-07-12 DIAGNOSIS — M25511 Pain in right shoulder: Secondary | ICD-10-CM | POA: Diagnosis not present

## 2022-07-12 DIAGNOSIS — R748 Abnormal levels of other serum enzymes: Secondary | ICD-10-CM | POA: Diagnosis not present

## 2022-07-12 DIAGNOSIS — M25571 Pain in right ankle and joints of right foot: Secondary | ICD-10-CM | POA: Diagnosis not present

## 2022-07-12 LAB — POCT GLYCOSYLATED HEMOGLOBIN (HGB A1C): Hemoglobin A1C: 7.3 % — AB (ref 4.0–5.6)

## 2022-07-12 NOTE — Assessment & Plan Note (Signed)
Recent w/u with GI yielded no results Will continue to monitor, last checked 1/24

## 2022-07-12 NOTE — Assessment & Plan Note (Signed)
Ref to pt Advised continue stretching and heat

## 2022-07-12 NOTE — Assessment & Plan Note (Signed)
Ordered imaging of the foot and ankle Likely ref to ortho

## 2022-07-12 NOTE — Assessment & Plan Note (Addendum)
A1c today 7.3% previously 6.9%  Pt manages with metformin 1000 mg bid, jardiance 25 mg.  Failed victoza 2/2 GI SE  Monitor glucose, increase exercise. Gave pt another sample of freestyle, he is unable to get covered by insurnace.  On statin, ace/arb Foot exam , uacr, optho utd F/u 4 mo

## 2022-07-12 NOTE — Progress Notes (Signed)
Established patient visit   Patient: Phillip Robles   DOB: 06/17/1965   57 y.o. Male  MRN: [id number] Visit Date: 07/12/2022  Today's healthcare provider: [name], PA-C   Cc. Dm f/u   Subjective    HPI HPI   Diabetes f/u Injured right foot/shoulder--burning, sore--4 months Last edited by Elta Guadeloupe, CMA on 07/12/2022  8:56 AM.      Right foot pain  -Pt reports an injury in October where he stomped or twisted on his right foot to catch falling camera gear. He was able to walk on it right away. Reports some bruising on the midfoot and continued swelling and pain since then.   Right shoulder pain -For the past few weeks/months. Feels the 'tendons rub in groove'. All ROM is painful. He sometimes appreciates some swelling. Denies injury.   Diabetes Mellitus Type II, Follow-up  Lab Results  Component Value Date   HGBA1C 7.3 (A) 07/12/2022   HGBA1C 6.9 (A) 03/11/2022   HGBA1C 7.8 (A) 12/09/2021   Wt Readings from Last 3 Encounters:  07/12/22 238 lb (108 kg)  03/11/22 233 lb (105.7 kg)  01/01/22 228 lb (103.4 kg)   Last seen for diabetes 4 months ago. Pt managed with metformin 1000 mg  bid, jardiance 25 mg. Was managed with victoza 1.8 mg but stopped 2/2 GI side effects. Pt was not restarted and nothing was added.  He reports excellent compliance with treatment. He is not having side effects.   Home blood sugar records: not checking  Episodes of hypoglycemia? Occasional symptoms of low- but not checking    Current insulin regiment: none   Pertinent Labs: Lab Results  Component Value Date   CHOL 116 09/03/2021   HDL 51 09/03/2021   LDLCALC 48 09/03/2021   TRIG 90 09/03/2021   CHOLHDL 2.3 09/03/2021   Lab Results  Component Value Date   NA 140 03/11/2022   K 4.4 03/11/2022   CREATININE 1.03 03/11/2022   EGFR 85 03/11/2022   LABMICR <3.0 12/09/2021   MICRALBCREAT <6 12/09/2021      ---------------------------------------------------------------------------------------------------   Medications: Outpatient Medications Prior to Visit  Medication Sig   aspirin EC 81 MG tablet Take 81 mg by mouth daily. Swallow whole.   atorvastatin (LIPITOR) 80 MG tablet Take 1 tablet (80 mg total) by mouth daily.   Continuous Blood Gluc Sensor (FREESTYLE LIBRE 2 SENSOR) MISC Use to check blood sugar continuously replace every 14 days   ENTRESTO 24-26 MG Take 1 tablet by mouth 2 (two) times daily.   ezetimibe (ZETIA) 10 MG tablet Take 1 tablet (10 mg total) by mouth daily.   JARDIANCE 25 MG TABS tablet Take 1 tablet (25 mg total) by mouth daily.   metFORMIN (GLUCOPHAGE) 1000 MG tablet Take 1 tablet (1,000 mg total) by mouth 2 (two) times daily.   metoprolol succinate (TOPROL-XL) 25 MG 24 hr tablet Take 1 tablet (25 mg total) by mouth 2 (two) times daily.   timolol (BETIMOL) 0.25 % ophthalmic solution    timolol (TIMOPTIC) 0.5 % ophthalmic solution SMARTSIG:In Eye(s)   No facility-administered medications prior to visit.    Review of Systems  Constitutional:  Negative for fatigue and fever.  Respiratory:  Negative for cough and shortness of breath.   Cardiovascular:  Negative for chest pain, palpitations and leg swelling.  Musculoskeletal:  Positive for arthralgias.  Neurological:  Negative for dizziness and headaches.       Objective    BP  124/89 (BP Location: Left Arm, Patient Position: Sitting, Cuff Size: Normal)   Pulse 86   Temp 97.9 F (36.6 C)   Ht 6' 4"$  (1.93 m)   Wt 238 lb (108 kg)   SpO2 98%   BMI 28.97 kg/m    Physical Exam Vitals reviewed.  Constitutional:      Appearance: He is not ill-appearing.  HENT:     Head: Normocephalic.  Eyes:     Conjunctiva/sclera: Conjunctivae normal.  Cardiovascular:     Rate and Rhythm: Normal rate.  Pulmonary:     Effort: Pulmonary effort is normal. No respiratory distress.  Musculoskeletal:     Comments:  Tenderness to the ant right shoulder Pt able to do most ROM but painful ; some limited abduction  Right foot with some midfoot edema, tenderness.  No visible ecchymosis.  No ankle swelling  Neurological:     General: No focal deficit present.     Mental Status: He is alert and oriented to person, place, and time.  Psychiatric:        Mood and Affect: Mood normal.        Behavior: Behavior normal.     Results for orders placed or performed in visit on 07/12/22  POCT glycosylated hemoglobin (Hb A1C)  Result Value Ref Range   Hemoglobin A1C 7.3 (A) 4.0 - 5.6 %   HbA1c POC (<> result, manual entry)     HbA1c, POC (prediabetic range)     HbA1c, POC (controlled diabetic range)      Assessment & Plan     Problem List Items Addressed This Visit       Endocrine   Type 2 diabetes mellitus with diabetic polyneuropathy, without long-term current use of insulin (HCC) - Primary    A1c today 7.3% previously 6.9%  Pt manages with metformin 1000 mg bid, jardiance 25 mg.  Failed victoza 2/2 GI SE  Monitor glucose, increase exercise. Gave pt another sample of freestyle, he is unable to get covered by insurnace.  On statin, ace/arb Foot exam , uacr, optho utd F/u 4 mo       Relevant Orders   POCT glycosylated hemoglobin (Hb A1C) (Completed)     Other   Elevated alkaline phosphatase level    Recent w/u with GI yielded no results Will continue to monitor, last checked 1/24      Right foot pain    Ordered imaging of the foot and ankle Likely ref to ortho      Relevant Orders   DG Foot Complete Right   DG Ankle Complete Right   Acute pain of right shoulder    Ref to pt Advised continue stretching and heat      Relevant Orders   Ambulatory referral to Physical Therapy    Return in about 4 months (around 11/10/2022), or if symptoms worsen or fail to improve, for DMII.      I, [name], PA-C have reviewed all documentation for this visit. The documentation on  07/12/22   for the exam, diagnosis, procedures, and orders are all accurate and complete.  [name], PA-C [facility] [address] [place], [place], [address] Office: [phone] Fax: [place]

## 2022-07-13 ENCOUNTER — Telehealth: Payer: Self-pay | Admitting: Cardiology

## 2022-07-13 ENCOUNTER — Other Ambulatory Visit: Payer: Self-pay | Admitting: Physician Assistant

## 2022-07-13 DIAGNOSIS — M79671 Pain in right foot: Secondary | ICD-10-CM

## 2022-07-13 NOTE — Telephone Encounter (Signed)
Called patient.  Reviewed results.   The patient has been notified of the result and verbalized understanding.  All questions (if any) were answered. Janan Ridge, [place] 07/13/2022 10:05 AM

## 2022-07-13 NOTE — Telephone Encounter (Signed)
Patient is returning CMA's call regarding echo results. Please advise.

## 2022-07-15 DIAGNOSIS — M7501 Adhesive capsulitis of right shoulder: Secondary | ICD-10-CM | POA: Diagnosis not present

## 2022-07-15 DIAGNOSIS — M7541 Impingement syndrome of right shoulder: Secondary | ICD-10-CM | POA: Diagnosis not present

## 2022-07-26 DIAGNOSIS — M14679 Charcot's joint, unspecified ankle and foot: Secondary | ICD-10-CM | POA: Insufficient documentation

## 2022-07-26 DIAGNOSIS — E1140 Type 2 diabetes mellitus with diabetic neuropathy, unspecified: Secondary | ICD-10-CM | POA: Diagnosis not present

## 2022-07-26 DIAGNOSIS — M14671 Charcot's joint, right ankle and foot: Secondary | ICD-10-CM | POA: Diagnosis not present

## 2022-08-05 DIAGNOSIS — M25511 Pain in right shoulder: Secondary | ICD-10-CM | POA: Diagnosis not present

## 2022-08-10 DIAGNOSIS — M14671 Charcot's joint, right ankle and foot: Secondary | ICD-10-CM | POA: Diagnosis not present

## 2022-08-10 DIAGNOSIS — E1140 Type 2 diabetes mellitus with diabetic neuropathy, unspecified: Secondary | ICD-10-CM | POA: Diagnosis not present

## 2022-08-19 DIAGNOSIS — M25511 Pain in right shoulder: Secondary | ICD-10-CM | POA: Diagnosis not present

## 2022-08-23 DIAGNOSIS — E1140 Type 2 diabetes mellitus with diabetic neuropathy, unspecified: Secondary | ICD-10-CM | POA: Diagnosis not present

## 2022-08-23 DIAGNOSIS — M14671 Charcot's joint, right ankle and foot: Secondary | ICD-10-CM | POA: Diagnosis not present

## 2022-08-26 DIAGNOSIS — M25511 Pain in right shoulder: Secondary | ICD-10-CM | POA: Diagnosis not present

## 2022-09-01 ENCOUNTER — Other Ambulatory Visit: Payer: Self-pay | Admitting: Physician Assistant

## 2022-09-01 DIAGNOSIS — M25511 Pain in right shoulder: Secondary | ICD-10-CM | POA: Diagnosis not present

## 2022-09-01 DIAGNOSIS — I152 Hypertension secondary to endocrine disorders: Secondary | ICD-10-CM

## 2022-09-01 DIAGNOSIS — I252 Old myocardial infarction: Secondary | ICD-10-CM

## 2022-09-01 NOTE — Telephone Encounter (Signed)
Requested Prescriptions  Pending Prescriptions Disp Refills   metoprolol succinate (TOPROL-XL) 25 MG 24 hr tablet [Pharmacy Med Name: METOPROLOL SUCCINATE ER 25 MG TAB] 180 tablet 0    Sig: TAKE 1 TABLET BY MOUTH TWICE DAILY     Cardiovascular:  Beta Blockers Passed - 09/01/2022 11:13 AM      Passed - Last BP in normal range    BP Readings from Last 1 Encounters:  07/12/22 124/89         Passed - Last Heart Rate in normal range    Pulse Readings from Last 1 Encounters:  07/12/22 86         Passed - Valid encounter within last 6 months    Recent Outpatient Visits           1 month ago Type 2 diabetes mellitus with diabetic polyneuropathy, without long-term current use of insulin ([place])   [place] Thedore Mins, [place], PA-C   5 months ago Type 2 diabetes mellitus with diabetic polyneuropathy, without long-term current use of insulin ([place])   [place] Thedore Mins, [place], PA-C   8 months ago Type 2 diabetes mellitus with diabetic polyneuropathy, without long-term current use of insulin ([place])   [place] Thedore Mins, [place], PA-C   12 months ago Type 2 diabetes mellitus with diabetic polyneuropathy, without long-term current use of insulin ([place])   [place] [name], [place]       Future Appointments             Tomorrow Kate Sable, MD [place] at [place]   In 2 months Thedore Mins, Ria [place], PA-C [place], [facility]             ENTRESTO 24-26 MG [Pharmacy Med Name: ENTRESTO 24-26 MG TAB] 180 tablet 0    Sig: TAKE 1 TABLET BY MOUTH TWICE DAILY     Off-Protocol Failed - 09/01/2022 11:13 AM      Failed - Medication not assigned to a protocol, review manually.      Passed - Valid encounter within last 12 months    Recent Outpatient Visits           1 month ago Type 2 diabetes mellitus with diabetic polyneuropathy, without  long-term current use of insulin ([place])   [place] Thedore Mins, [place], PA-C   5 months ago Type 2 diabetes mellitus with diabetic polyneuropathy, without long-term current use of insulin ([place])   [place] Thedore Mins, [place], PA-C   8 months ago Type 2 diabetes mellitus with diabetic polyneuropathy, without long-term current use of insulin [facility])   [place] Thedore Mins, [place], PA-C   12 months ago Type 2 diabetes mellitus with diabetic polyneuropathy, without long-term current use of insulin [facility])   [place] [name], [place]       Future Appointments             Tomorrow Kate Sable, MD [place] at [place]   In 2 months Thedore Mins, [place] [facility], [place]

## 2022-09-01 NOTE — Telephone Encounter (Signed)
Requested medication (s) are due for refill today: yes  Requested medication (s) are on the active medication list: yes    Last refill: 03/11/22  #180  0 refills  Future visit scheduled yes  11/10/22  Notes to clinic:Off protocol, please review. Thank you.  Requested Prescriptions  Pending Prescriptions Disp Refills   ENTRESTO 24-26 MG [Pharmacy Med Name: ENTRESTO 24-26 MG TAB] 180 tablet 0    Sig: TAKE 1 TABLET BY MOUTH TWICE DAILY     Off-Protocol Failed - 09/01/2022 11:13 AM      Failed - Medication not assigned to a protocol, review manually.      Passed - Valid encounter within last 12 months    Recent Outpatient Visits           1 month ago Type 2 diabetes mellitus with diabetic polyneuropathy, without long-term current use of insulin ([place])   [place] Thedore Mins, [place], PA-C   5 months ago Type 2 diabetes mellitus with diabetic polyneuropathy, without long-term current use of insulin ([place])   [place] Thedore Mins, [place], PA-C   8 months ago Type 2 diabetes mellitus with diabetic polyneuropathy, without long-term current use of insulin ([place])   [place] Thedore Mins, [place], PA-C   12 months ago Type 2 diabetes mellitus with diabetic polyneuropathy, without long-term current use of insulin ([place])   [place] [name], [place]       Future Appointments             Tomorrow Kate Sable, MD [place] at [place]   In 2 months [name], PA-C [facility], [facility]            Signed Prescriptions Disp Refills   metoprolol succinate (TOPROL-XL) 25 MG 24 hr tablet 180 tablet 0    Sig: TAKE 1 TABLET BY MOUTH TWICE DAILY     Cardiovascular:  Beta Blockers Passed - 09/01/2022 11:13 AM      Passed - Last BP in normal range    BP Readings from Last 1 Encounters:  07/12/22 124/89         Passed - Last Heart Rate in  normal range    Pulse Readings from Last 1 Encounters:  07/12/22 86         Passed - Valid encounter within last 6 months    Recent Outpatient Visits           1 month ago Type 2 diabetes mellitus with diabetic polyneuropathy, without long-term current use of insulin ([place])   [place] Thedore Mins, [place], PA-C   5 months ago Type 2 diabetes mellitus with diabetic polyneuropathy, without long-term current use of insulin ([place])   [place] Thedore Mins, [place], PA-C   8 months ago Type 2 diabetes mellitus with diabetic polyneuropathy, without long-term current use of insulin ([place])   [place] Thedore Mins, [place], PA-C   12 months ago Type 2 diabetes mellitus with diabetic polyneuropathy, without long-term current use of insulin ([place])   [place] [name], [place]       Future Appointments             Tomorrow Kate Sable, MD [place] at [place]   In 2 months Thedore Mins, [place] [facility], [place]

## 2022-09-02 ENCOUNTER — Ambulatory Visit: Payer: BC Managed Care – PPO | Attending: Cardiology | Admitting: Cardiology

## 2022-09-02 VITALS — BP 124/86 | Ht 76.0 in | Wt 230.0 lb

## 2022-09-02 DIAGNOSIS — E1169 Type 2 diabetes mellitus with other specified complication: Secondary | ICD-10-CM | POA: Diagnosis not present

## 2022-09-02 DIAGNOSIS — E785 Hyperlipidemia, unspecified: Secondary | ICD-10-CM

## 2022-09-02 DIAGNOSIS — E1142 Type 2 diabetes mellitus with diabetic polyneuropathy: Secondary | ICD-10-CM

## 2022-09-02 DIAGNOSIS — E7800 Pure hypercholesterolemia, unspecified: Secondary | ICD-10-CM | POA: Diagnosis not present

## 2022-09-02 DIAGNOSIS — I10 Essential (primary) hypertension: Secondary | ICD-10-CM | POA: Diagnosis not present

## 2022-09-02 DIAGNOSIS — Z951 Presence of aortocoronary bypass graft: Secondary | ICD-10-CM | POA: Diagnosis not present

## 2022-09-02 MED ORDER — ATORVASTATIN CALCIUM 80 MG PO TABS: 80.0000 mg | ORAL_TABLET | Freq: Every day | ORAL | 2 refills | Status: DC

## 2022-09-02 MED ORDER — EZETIMIBE 10 MG PO TABS: 10.0000 mg | ORAL_TABLET | Freq: Every day | ORAL | 2 refills | Status: DC

## 2022-09-02 NOTE — Progress Notes (Signed)
Cardiology Office Note:    Date:  09/02/2022   ID:  Phillip Robles, DOB [birth year], MRN [id number]  PCP:  [name], PA-C   [facility] Providers Cardiologist:  Kate Sable, MD     Referring MD: [name], PA-C   Chief Complaint  Patient presents with   Follow-up    No acute cardiac concerns today.  Does have a broken right foot in 03/2022    History of Present Illness:    Phillip Robles is a 57 y.o. male with a hx of CAD (prior PCI 2007, 2009), MI s/p CABG x 5 2009, hypertension, hyperlipidemia, diabetes who presents for follow-up.  He stumbled on a curb 5 months ago fracturing his right foot.  Currently in a cast.  Denies chest pain or shortness of breath, compliant with medications including Toprol-XL, Entresto as prescribed, denies dizziness.  Denies fatigue, edema.  Prior notes Echo 09/2021 EF 45 to 50% History of fatigue with atenolol, carvedilol Patient moved to the area from [facility].  Had an MI in 2007 requiring stents.  2 years later he had additional stent placements.  In December 2009, due to persistent symptoms of chest discomfort, he underwent left heart cath showing multivessel disease requiring CABG.   left heart cath in 2011 and was told everything was okay.   Past Medical History:  Diagnosis Date   Anemia    Blood transfusion without reported diagnosis    Chronic gastritis w/ Metaplasia    Diabetes mellitus without complication (HCC)    Hypertension    Myocardial infarction [facility])     Past Surgical History:  Procedure Laterality Date   CARDIAC CATHETERIZATION     CORONARY ARTERY BYPASS GRAFT     ESOPHAGOGASTRODUODENOSCOPY (EGD) WITH PROPOFOL N/A 02/16/2022   Procedure: ESOPHAGOGASTRODUODENOSCOPY (EGD) WITH PROPOFOL;  Surgeon: Lucilla Lame, MD;  Location: [facility] ENDOSCOPY;  Service: Endoscopy;  Laterality: N/A;   thumb surgery      Current Medications: Current Meds  Medication Sig   aspirin EC 81 MG tablet Take 81 mg by  mouth daily. Swallow whole.   Continuous Blood Gluc Sensor (FREESTYLE LIBRE 2 SENSOR) MISC Use to check blood sugar continuously replace every 14 days   JARDIANCE 25 MG TABS tablet Take 1 tablet (25 mg total) by mouth daily.   metFORMIN (GLUCOPHAGE) 1000 MG tablet Take 1 tablet (1,000 mg total) by mouth 2 (two) times daily.   metoprolol succinate (TOPROL-XL) 25 MG 24 hr tablet TAKE 1 TABLET BY MOUTH TWICE DAILY   timolol (BETIMOL) 0.25 % ophthalmic solution    timolol (TIMOPTIC) 0.5 % ophthalmic solution SMARTSIG:In Eye(s)   [DISCONTINUED] atorvastatin (LIPITOR) 80 MG tablet Take 1 tablet (80 mg total) by mouth daily.   [DISCONTINUED] ENTRESTO 24-26 MG Take 1 tablet by mouth 2 (two) times daily.   [DISCONTINUED] ezetimibe (ZETIA) 10 MG tablet Take 1 tablet (10 mg total) by mouth daily.     Allergies:   Other   Social History   Socioeconomic History   Marital status: Married    Spouse name: Not on file   Number of children: Not on file   Years of education: Not on file   Highest education level: Not on file  Occupational History   Not on file  Tobacco Use   Smoking status: Never   Smokeless tobacco: Former    Types: Chew    Quit date: 03/2019  Vaping Use   Vaping Use: Never used  Substance and Sexual Activity   Alcohol use:  Not Currently    Comment: Quit 07/2019   Drug use: Never   Sexual activity: Yes  Other Topics Concern   Not on file  Social History Narrative   Not on file   Social Determinants of Health   Financial Resource Strain: Not on file  Food Insecurity: Not on file  Transportation Needs: Not on file  Physical Activity: Not on file  Stress: Not on file  Social Connections: Not on file     Family History: The patient's family history includes Bipolar disorder in his sister; COPD in his maternal uncle; Cancer in his maternal grandmother; Heart Problems in his father, mother, paternal grandfather, paternal grandmother, and paternal uncle.  ROS:   Please  see the history of present illness.     All other systems reviewed and are negative.  EKGs/Labs/Other Studies Reviewed:    The following studies were reviewed today:   EKG:  EKG is  ordered today.  The ekg ordered today demonstrates normal sinus rhythm, possible old inferior infarct  Recent Labs: 03/11/2022: ALT 43; BUN 15; Creatinine, Ser 1.03; Hemoglobin 14.1; Platelets 117; Potassium 4.4; Sodium 140  Recent Lipid Panel    Component Value Date/Time   CHOL 116 09/03/2021 1114   TRIG 90 09/03/2021 1114   HDL 51 09/03/2021 1114   CHOLHDL 2.3 09/03/2021 1114   LDLCALC 48 09/03/2021 1114     Risk Assessment/Calculations:          Physical Exam:    VS:  BP 124/86 (BP Location: Left Arm, Patient Position: Sitting)   Ht 6\' 4"  (1.93 m)   Wt 230 lb (104.3 kg)   SpO2 97%   BMI 28.00 kg/m     Wt Readings from Last 3 Encounters:  09/02/22 230 lb (104.3 kg)  07/12/22 238 lb (108 kg)  03/11/22 233 lb (105.7 kg)     GEN:  Well nourished, well developed in no acute distress HEENT: Normal NECK: No JVD; No carotid bruits CARDIAC: RRR, no murmurs, rubs, gallops RESPIRATORY:  Clear to auscultation without rales, wheezing or rhonchi  ABDOMEN: Soft, non-tender, non-distended MUSCULOSKELETAL:  No edema; right foot in cast. SKIN: Warm and dry NEUROLOGIC:  Alert and oriented x 3 PSYCHIATRIC:  Normal affect   ASSESSMENT:    1. Hx of CABG   2. Primary hypertension   3. Pure hypercholesterolemia   4. Hyperlipidemia associated with type 2 diabetes mellitus   5. Type 2 diabetes mellitus with diabetic polyneuropathy, without long-term current use of insulin     PLAN:    In order of problems listed above:  CAD/CABG x5.  Denies chest pain.  Echo with EF 45 to 50%, unchanged from prior.  Continue aspirin, Toprol-XL, Lipitor 80.  Patient is euvolemic. Hypertension, BP controlled.  Continue Toprol-XL, Entresto. Hyperlipidemia, cholesterol controlled, LDL at goal.  Continue Lipitor  80.  Follow-up in 1 year.     Medication Adjustments/Labs and Tests Ordered: Current medicines are reviewed at length with the patient today.  Concerns regarding medicines are outlined above.  Orders Placed This Encounter  Procedures   EKG 12-Lead   Meds ordered this encounter  Medications   atorvastatin (LIPITOR) 80 MG tablet    Sig: Take 1 tablet (80 mg total) by mouth daily.    Dispense:  90 tablet    Refill:  2   ezetimibe (ZETIA) 10 MG tablet    Sig: Take 1 tablet (10 mg total) by mouth daily.    Dispense:  90 tablet  Refill:  2    Patient Instructions  Medication Instructions: Your physician recommends that you continue on your current medications as directed. Please refer to the Current Medication list given to you today.  *If you need a refill on your cardiac medications before your next appointment, please call your pharmacy*  Lab Work: No labs ordered  If you have labs (blood work) drawn today and your tests are completely normal, you will receive your results only by: [place] (if you have MyChart) OR A paper copy in the mail If you have any lab test that is abnormal or we need to change your treatment, we will call you to review the results.  Testing/Procedures: No labs ordered  Follow-Up: At [facility], you and your health needs are our priority.  As part of our continuing mission to provide you with exceptional heart care, we have created designated Provider Care Teams.  These Care Teams include your primary Cardiologist (physician) and Advanced Practice Providers (APPs -  Physician Assistants and Nurse Practitioners) who all work together to provide you with the care you need, when you need it.  We recommend signing up for the patient portal called "MyChart".  Sign up information is provided on this After Visit Summary.  MyChart is used to connect with patients for Virtual Visits (Telemedicine).  Patients are able to view lab/test results,  encounter notes, upcoming appointments, etc.  Non-urgent messages can be sent to your provider as well.   To learn more about what you can do with MyChart, go to [email or web address].    Your next appointment:   1 year(s)  Provider:   You may see Kate Sable, MD or one of the following Advanced Practice Providers on your designated Care Team:   Murray Hodgkins, NP Christell Faith, PA-C [place] Kathlen Mody, PA-C Gerrie Nordmann, NP    Signed, Kate Sable, MD  09/02/2022 12:02 PM    [place]

## 2022-09-02 NOTE — Patient Instructions (Signed)
Medication Instructions: Your physician recommends that you continue on your current medications as directed. Please refer to the Current Medication list given to you today.  *If you need a refill on your cardiac medications before your next appointment, please call your pharmacy*  Lab Work: No labs ordered  If you have labs (blood work) drawn today and your tests are completely normal, you will receive your results only by: [place] (if you have MyChart) OR A paper copy in the mail If you have any lab test that is abnormal or we need to change your treatment, we will call you to review the results.  Testing/Procedures: No labs ordered  Follow-Up: At [facility], you and your health needs are our priority.  As part of our continuing mission to provide you with exceptional heart care, we have created designated Provider Care Teams.  These Care Teams include your primary Cardiologist (physician) and Advanced Practice Providers (APPs -  Physician Assistants and Nurse Practitioners) who all work together to provide you with the care you need, when you need it.  We recommend signing up for the patient portal called "MyChart".  Sign up information is provided on this After Visit Summary.  MyChart is used to connect with patients for Virtual Visits (Telemedicine).  Patients are able to view lab/test results, encounter notes, upcoming appointments, etc.  Non-urgent messages can be sent to your provider as well.   To learn more about what you can do with MyChart, go to [email or web address].    Your next appointment:   1 year(s)  Provider:   You may see Kate Sable, MD or one of the following Advanced Practice Providers on your designated Care Team:   Murray Hodgkins, NP Christell Faith, PA-C Cadence Kathlen Mody, PA-C Gerrie Nordmann, NP

## 2022-09-06 DIAGNOSIS — E1140 Type 2 diabetes mellitus with diabetic neuropathy, unspecified: Secondary | ICD-10-CM | POA: Diagnosis not present

## 2022-09-06 DIAGNOSIS — M14671 Charcot's joint, right ankle and foot: Secondary | ICD-10-CM | POA: Diagnosis not present

## 2022-09-09 DIAGNOSIS — M25511 Pain in right shoulder: Secondary | ICD-10-CM | POA: Diagnosis not present

## 2022-09-17 ENCOUNTER — Encounter: Payer: Self-pay | Admitting: *Deleted

## 2022-09-20 ENCOUNTER — Encounter: Payer: Self-pay | Admitting: *Deleted

## 2022-09-20 ENCOUNTER — Ambulatory Visit: Payer: BC Managed Care – PPO | Admitting: Certified Registered"

## 2022-09-20 ENCOUNTER — Ambulatory Visit
Admission: RE | Admit: 2022-09-20 | Discharge: 2022-09-20 | Disposition: A | Discharge status: Home / Self Care | Payer: BC Managed Care – PPO | Attending: Gastroenterology | Admitting: Gastroenterology

## 2022-09-20 ENCOUNTER — Encounter
Admission: RE | Disposition: A | Discharge status: Home / Self Care | Payer: Self-pay | Source: Home / Self Care | Attending: Gastroenterology

## 2022-09-20 DIAGNOSIS — I110 Hypertensive heart disease with heart failure: Secondary | ICD-10-CM | POA: Diagnosis not present

## 2022-09-20 DIAGNOSIS — I509 Heart failure, unspecified: Secondary | ICD-10-CM | POA: Diagnosis not present

## 2022-09-20 DIAGNOSIS — K219 Gastro-esophageal reflux disease without esophagitis: Secondary | ICD-10-CM | POA: Diagnosis not present

## 2022-09-20 DIAGNOSIS — E119 Type 2 diabetes mellitus without complications: Secondary | ICD-10-CM | POA: Diagnosis not present

## 2022-09-20 DIAGNOSIS — R1013 Epigastric pain: Secondary | ICD-10-CM | POA: Diagnosis not present

## 2022-09-20 DIAGNOSIS — K3189 Other diseases of stomach and duodenum: Secondary | ICD-10-CM | POA: Insufficient documentation

## 2022-09-20 DIAGNOSIS — Z7984 Long term (current) use of oral hypoglycemic drugs: Secondary | ICD-10-CM | POA: Diagnosis not present

## 2022-09-20 DIAGNOSIS — I8500 Esophageal varices without bleeding: Secondary | ICD-10-CM | POA: Diagnosis not present

## 2022-09-20 DIAGNOSIS — I864 Gastric varices: Secondary | ICD-10-CM | POA: Diagnosis not present

## 2022-09-20 DIAGNOSIS — K31A0 Gastric intestinal metaplasia, unspecified: Secondary | ICD-10-CM | POA: Diagnosis not present

## 2022-09-20 DIAGNOSIS — I10 Essential (primary) hypertension: Secondary | ICD-10-CM | POA: Diagnosis not present

## 2022-09-20 DIAGNOSIS — K2950 Unspecified chronic gastritis without bleeding: Secondary | ICD-10-CM | POA: Diagnosis not present

## 2022-09-20 DIAGNOSIS — K766 Portal hypertension: Secondary | ICD-10-CM | POA: Diagnosis not present

## 2022-09-20 DIAGNOSIS — K2940 Chronic atrophic gastritis without bleeding: Secondary | ICD-10-CM | POA: Diagnosis not present

## 2022-09-20 DIAGNOSIS — K2970 Gastritis, unspecified, without bleeding: Secondary | ICD-10-CM | POA: Diagnosis not present

## 2022-09-20 HISTORY — PX: ESOPHAGOGASTRODUODENOSCOPY (EGD) WITH PROPOFOL: SHX5813

## 2022-09-20 HISTORY — DX: Barrett's esophagus without dysplasia: K22.70

## 2022-09-20 LAB — GLUCOSE, CAPILLARY: Glucose-Capillary: 136 mg/dL — ABNORMAL HIGH (ref 70–99)

## 2022-09-20 SURGERY — ESOPHAGOGASTRODUODENOSCOPY (EGD) WITH PROPOFOL
Anesthesia: General

## 2022-09-20 MED ORDER — DEXMEDETOMIDINE HCL IN NACL 80 MCG/20ML IV SOLN
INTRAVENOUS | Status: DC | PRN
  Administered 2022-09-20: 8 ug via BUCCAL

## 2022-09-20 MED ORDER — PROPOFOL 10 MG/ML IV BOLUS
INTRAVENOUS | Status: DC | PRN
  Administered 2022-09-20 (×2): 10 mg via INTRAVENOUS
  Administered 2022-09-20: 20 mg via INTRAVENOUS
  Administered 2022-09-20: 120 mg via INTRAVENOUS
  Administered 2022-09-20: 20 mg via INTRAVENOUS
  Administered 2022-09-20 (×3): 10 mg via INTRAVENOUS

## 2022-09-20 MED ORDER — LIDOCAINE HCL (CARDIAC) PF 100 MG/5ML IV SOSY
PREFILLED_SYRINGE | INTRAVENOUS | Status: DC | PRN
  Administered 2022-09-20: 100 mg via INTRAVENOUS

## 2022-09-20 MED ORDER — SODIUM CHLORIDE 0.9 % IV SOLN: INTRAVENOUS | Status: DC

## 2022-09-20 MED ORDER — PROPOFOL 10 MG/ML IV BOLUS
INTRAVENOUS | Status: AC
  Filled 2022-09-20: qty 40

## 2022-09-20 MED ORDER — LIDOCAINE HCL (PF) 2 % IJ SOLN
INTRAMUSCULAR | Status: AC
  Filled 2022-09-20: qty 5

## 2022-09-20 NOTE — Op Note (Addendum)
[facility] Patient Name: Phillip Robles Procedure Date: 09/20/2022 9:05 AM MRN: [id number] Account #: [id number] Date of Birth: [birth year] Admit Type: Outpatient Age: 57 Room: [facility] ENDO ROOM 3 Gender: Male Note Status: Finalized Instrument Name: Patton Salles Endoscope [id number] Procedure:             Upper GI endoscopy Indications:           Dyspepsia, Intestinal metaplasia Providers:             Eather Colas MD, MD Medicines:             Monitored Anesthesia Care Complications:         No immediate complications. Estimated blood loss:                         Minimal. Procedure:             Pre-Anesthesia Assessment:                        - Prior to the procedure, a History and Physical was                         performed, and patient medications and allergies were                         reviewed. The patient is competent. The risks and                         benefits of the procedure and the sedation options and                         risks were discussed with the patient. All questions                         were answered and informed consent was obtained.                         Patient identification and proposed procedure were                         verified by the physician, the nurse, the                         anesthesiologist, the anesthetist and the technician                         in the endoscopy suite. Mental Status Examination:                         alert and oriented. Airway Examination: normal                         oropharyngeal airway and neck mobility. Respiratory                         Examination: clear to auscultation. CV Examination:                         normal. Prophylactic Antibiotics: The patient does not  require prophylactic antibiotics. Prior                         Anticoagulants: The patient has taken no anticoagulant                         or antiplatelet agents except for  aspirin. ASA Grade                         Assessment: III - A patient with severe systemic                         disease. After reviewing the risks and benefits, the                         patient was deemed in satisfactory condition to                         undergo the procedure. The anesthesia plan was to use                         monitored anesthesia care (MAC). Immediately prior to                         administration of medications, the patient was                         re-assessed for adequacy to receive sedatives. The                         heart rate, respiratory rate, oxygen saturations,                         blood pressure, adequacy of pulmonary ventilation, and                         response to care were monitored throughout the                         procedure. The physical status of the patient was                         re-assessed after the procedure.                        After obtaining informed consent, the endoscope was                         passed under direct vision. Throughout the procedure,                         the patient's blood pressure, pulse, and oxygen                         saturations were monitored continuously. The Endoscope                         was introduced through the mouth, and advanced to the  second part of duodenum. The upper GI endoscopy was                         accomplished without difficulty. The patient tolerated                         the procedure well. Findings:      Grade II varices were found in the lower third of the esophagus.      Type 1 gastroesophageal varices (GOV1, esophageal varices which extend       along the lesser curvature) with no bleeding were found in the cardia.       There were no stigmata of recent bleeding. They were small in largest       diameter.      Mild portal hypertensive gastropathy was found in the gastric body and       in the gastric antrum. Biopsies  were taken with a cold forceps for       histology. Estimated blood loss: none.      The examined duodenum was normal. Impression:            - Grade II esophageal varices.                        - Type 1 gastroesophageal varices (GOV1, esophageal                         varices which extend along the lesser curvature),                         without bleeding.                        - Portal hypertensive gastropathy. Biopsied.                        - Normal examined duodenum. Recommendation:        - Discharge patient to home.                        - Resume previous diet.                        - Continue present medications.                        - Await pathology results.                        - Return to referring physician as previously                         scheduled.                        - Given findings of esophageal and gastric varices and                         extensive work-up of abnormal liver enzymes, will                         discuss need for liver biopsy. Will also recommend  starting non-selective beta blocker and initiating HCC                         surveillance. 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 Procedure Code(s):     --- Professional ---                        [phone], Esophagogastroduodenoscopy, flexible,                         transoral; with biopsy, single or multiple Diagnosis Code(s):     --- Professional ---                        I85.00, Esophageal varices without bleeding                        I86.4, Gastric varices                        K76.6, Portal hypertension                        K31.89, Other diseases of stomach and duodenum                        R10.13, Epigastric pain                        K31.A0, Gastric intestinal metaplasia, unspecified CPT copyright 2022 American Medical Association. All rights reserved. The codes documented in this report are preliminary and upon coder review may  be revised to meet  current compliance requirements. Eather Colas MD, MD 09/20/2022 9:45:54 AM Number of Addenda: 0 Note Initiated On: 09/20/2022 9:05 AM Estimated Blood Loss:  Estimated blood loss was minimal.      [facility]

## 2022-09-20 NOTE — Transfer of Care (Signed)
Immediate Anesthesia Transfer of Care Note  Patient: Phillip Robles  Procedure(s) Performed: ESOPHAGOGASTRODUODENOSCOPY (EGD) WITH PROPOFOL  Patient Location: Endoscopy Unit  Anesthesia Type:General  Level of Consciousness: drowsy  Airway & Oxygen Therapy: Patient Spontanous Breathing  Post-op Assessment: Report given to RN and Post -op Vital signs reviewed and stable  Post vital signs: Reviewed and stable  Last Vitals:  Vitals Value Taken Time  BP 91/66 09/20/22 0940  Temp 35.8 0940  Pulse 71 09/20/22 0940  Resp 17 09/20/22 0940  SpO2 95 % 09/20/22 0940  Vitals shown include unvalidated device data.  Last Pain:  Vitals:   09/20/22 0850  TempSrc: Temporal  PainSc: 0-No pain         Complications: No notable events documented.

## 2022-09-20 NOTE — Interval H&P Note (Signed)
History and Physical Interval Note:  09/20/2022 9:22 AM  Phillip Robles  has presented today for surgery, with the diagnosis of gerd.  The various methods of treatment have been discussed with the patient and family. After consideration of risks, benefits and other options for treatment, the patient has consented to  Procedure(s): ESOPHAGOGASTRODUODENOSCOPY (EGD) WITH PROPOFOL (N/A) as a surgical intervention.  The patient's history has been reviewed, patient examined, no change in status, stable for surgery.  I have reviewed the patient's chart and labs.  Questions were answered to the patient's satisfaction.     Regis Bill  Ok to proceed with EGD

## 2022-09-20 NOTE — Anesthesia Preprocedure Evaluation (Signed)
Anesthesia Evaluation  Patient identified by MRN, date of birth, ID band Patient awake    Reviewed: Allergy & Precautions, NPO status , Patient's Chart, lab work & pertinent test results  Airway Mallampati: III  TM Distance: >3 FB Neck ROM: full    Dental  (+) Chipped, Dental Advidsory Given   Pulmonary neg pulmonary ROS, neg shortness of breath, neg COPD   Pulmonary exam normal        Cardiovascular hypertension, +CHF  Normal cardiovascular exam     Neuro/Psych negative neurological ROS  negative psych ROS   GI/Hepatic negative GI ROS, Neg liver ROS,,,  Endo/Other  negative endocrine ROSdiabetes    Renal/GU negative Renal ROS  negative genitourinary   Musculoskeletal   Abdominal   Peds  Hematology negative hematology ROS (+)   Anesthesia Other Findings Past Medical History: No date: Anemia No date: Blood transfusion without reported diagnosis No date: Chronic gastritis w/ Metaplasia No date: Diabetes mellitus without complication No date: Hypertension 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 No date: Myocardial infarction  Past Surgical History: No date: CARDIAC CATHETERIZATION No date: CORONARY ARTERY BYPASS GRAFT 02/16/2022: ESOPHAGOGASTRODUODENOSCOPY (EGD) WITH PROPOFOL; N/A     Comment:  Procedure: ESOPHAGOGASTRODUODENOSCOPY (EGD) WITH               PROPOFOL;  Surgeon: Midge Minium, MD;  Location: [facility]               ENDOSCOPY;  Service: Endoscopy;  Laterality: N/A; No date: thumb surgery     Reproductive/Obstetrics negative OB ROS                              Anesthesia Physical Anesthesia Plan  ASA: 3  Anesthesia Plan: General   Post-op Pain Management: Minimal or no pain anticipated   Induction: Intravenous  PONV Risk Score and Plan: 3 and Propofol infusion, TIVA and Ondansetron  Airway Management Planned: Nasal Cannula  Additional Equipment: None  Intra-op Plan:    Post-operative Plan:   Informed Consent: I have reviewed the patients History and Physical, chart, labs and discussed the procedure including the risks, benefits and alternatives for the proposed anesthesia with the patient or authorized representative who has indicated his/her understanding and acceptance.     Dental advisory given  Plan Discussed with: CRNA and Surgeon  Anesthesia Plan Comments: (Discussed risks of anesthesia with patient, including possibility of difficulty with spontaneous ventilation under anesthesia necessitating airway intervention, PONV, and rare risks such as cardiac or respiratory or neurological events, and allergic reactions. Discussed the role of CRNA in patient's perioperative care. Patient understands.)         Anesthesia Quick Evaluation

## 2022-09-20 NOTE — H&P (Signed)
Outpatient short stay form Pre-procedure 09/20/2022  Regis Bill, MD  Primary Physician: Alfredia Ferguson, PA-C  Reason for visit:  Dyspepsia/History of GIM  History of present illness:    57 y/o gentleman with history of DM II and GIM of stomach here for EGD for gastric mapping. No blood thinners. No family history of GI malignancies. No neck surgeries.    Current Facility-Administered Medications:    0.9 %  sodium chloride infusion, , Intravenous, Continuous, Leno Mathes, Rossie Muskrat, MD, Last Rate: 20 mL/hr at 09/20/22 0908, New Bag at 09/20/22 0908  Medications Prior to Admission  Medication Sig Dispense Refill Last Dose   ENTRESTO 24-26 MG TAKE 1 TABLET BY MOUTH TWICE DAILY 180 tablet 1 09/20/2022   metoprolol succinate (TOPROL-XL) 25 MG 24 hr tablet TAKE 1 TABLET BY MOUTH TWICE DAILY 180 tablet 0 09/20/2022   aspirin EC 81 MG tablet Take 81 mg by mouth daily. Swallow whole.      atorvastatin (LIPITOR) 80 MG tablet Take 1 tablet (80 mg total) by mouth daily. 90 tablet 2    Continuous Blood Gluc Sensor (FREESTYLE LIBRE 2 SENSOR) MISC Use to check blood sugar continuously replace every 14 days 2 each 3    ezetimibe (ZETIA) 10 MG tablet Take 1 tablet (10 mg total) by mouth daily. 90 tablet 2    JARDIANCE 25 MG TABS tablet Take 1 tablet (25 mg total) by mouth daily. 90 tablet 1    metFORMIN (GLUCOPHAGE) 1000 MG tablet Take 1 tablet (1,000 mg total) by mouth 2 (two) times daily. 180 tablet 1    timolol (BETIMOL) 0.25 % ophthalmic solution       timolol (TIMOPTIC) 0.5 % ophthalmic solution SMARTSIG:In Eye(s)        Allergies  Allergen Reactions   Other     Mild pet and pollen     Past Medical History:  Diagnosis Date   Anemia    Blood transfusion without reported diagnosis    Chronic gastritis w/ Metaplasia    Diabetes mellitus without complication    Hypertension    Metaplasia of esophagus    Myocardial infarction     Review of systems:  Otherwise negative.     Physical Exam  Gen: Alert, oriented. Appears stated age.  HEENT: PERRLA. Lungs: No respiratory distress CV: RRR Abd: soft, benign, no masses Ext: No edema    Planned procedures: Proceed with EGD. The patient understands the nature of the planned procedure, indications, risks, alternatives and potential complications including but not limited to bleeding, infection, perforation, damage to internal organs and possible oversedation/side effects from anesthesia. The patient agrees and gives consent to proceed.  Please refer to procedure notes for findings, recommendations and patient disposition/instructions.     Regis Bill, MD [facility]

## 2022-09-20 NOTE — Anesthesia Postprocedure Evaluation (Signed)
Anesthesia Post Note  Patient: Phillip Robles  Procedure(s) Performed: ESOPHAGOGASTRODUODENOSCOPY (EGD) WITH PROPOFOL  Patient location during evaluation: Endoscopy Anesthesia Type: General Level of consciousness: awake and alert Pain management: pain level controlled Vital Signs Assessment: post-procedure vital signs reviewed and stable Respiratory status: spontaneous breathing, nonlabored ventilation, respiratory function stable and patient connected to nasal cannula oxygen Cardiovascular status: blood pressure returned to baseline and stable Postop Assessment: no apparent nausea or vomiting Anesthetic complications: no  No notable events documented.   Last Vitals:  Vitals:   09/20/22 0951 09/20/22 1001  BP: 91/68 100/73  Pulse: 73 71  Resp: 16 13  Temp:    SpO2: 94% 97%    Last Pain:  Vitals:   09/20/22 1001  TempSrc:   PainSc: 0-No pain                 Stephanie Coup

## 2022-09-21 LAB — SURGICAL PATHOLOGY

## 2022-09-22 ENCOUNTER — Encounter: Payer: Self-pay | Admitting: Gastroenterology

## 2022-09-24 DIAGNOSIS — M14671 Charcot's joint, right ankle and foot: Secondary | ICD-10-CM | POA: Diagnosis not present

## 2022-09-24 DIAGNOSIS — E1140 Type 2 diabetes mellitus with diabetic neuropathy, unspecified: Secondary | ICD-10-CM | POA: Diagnosis not present

## 2022-09-27 DIAGNOSIS — H4010X0 Unspecified open-angle glaucoma, stage unspecified: Secondary | ICD-10-CM | POA: Diagnosis not present

## 2022-10-11 DIAGNOSIS — M14671 Charcot's joint, right ankle and foot: Secondary | ICD-10-CM | POA: Diagnosis not present

## 2022-10-18 ENCOUNTER — Other Ambulatory Visit: Payer: Self-pay | Admitting: Physician Assistant

## 2022-10-18 DIAGNOSIS — E1142 Type 2 diabetes mellitus with diabetic polyneuropathy: Secondary | ICD-10-CM

## 2022-10-20 DIAGNOSIS — R7989 Other specified abnormal findings of blood chemistry: Secondary | ICD-10-CM | POA: Diagnosis not present

## 2022-10-20 DIAGNOSIS — I8500 Esophageal varices without bleeding: Secondary | ICD-10-CM | POA: Diagnosis not present

## 2022-11-02 ENCOUNTER — Other Ambulatory Visit: Payer: Self-pay | Admitting: Gastroenterology

## 2022-11-02 DIAGNOSIS — H4010X0 Unspecified open-angle glaucoma, stage unspecified: Secondary | ICD-10-CM | POA: Diagnosis not present

## 2022-11-02 DIAGNOSIS — E113313 Type 2 diabetes mellitus with moderate nonproliferative diabetic retinopathy with macular edema, bilateral: Secondary | ICD-10-CM | POA: Diagnosis not present

## 2022-11-02 DIAGNOSIS — I8500 Esophageal varices without bleeding: Secondary | ICD-10-CM

## 2022-11-02 DIAGNOSIS — R7989 Other specified abnormal findings of blood chemistry: Secondary | ICD-10-CM

## 2022-11-02 DIAGNOSIS — Z0100 Encounter for examination of eyes and vision without abnormal findings: Secondary | ICD-10-CM | POA: Diagnosis not present

## 2022-11-03 ENCOUNTER — Telehealth: Payer: Self-pay | Admitting: Cardiology

## 2022-11-03 ENCOUNTER — Other Ambulatory Visit: Payer: Self-pay | Admitting: Physician Assistant

## 2022-11-03 DIAGNOSIS — E1142 Type 2 diabetes mellitus with diabetic polyneuropathy: Secondary | ICD-10-CM

## 2022-11-03 DIAGNOSIS — M14671 Charcot's joint, right ankle and foot: Secondary | ICD-10-CM | POA: Diagnosis not present

## 2022-11-03 NOTE — Telephone Encounter (Signed)
error 

## 2022-11-09 NOTE — Progress Notes (Unsigned)
I,Halden Phegley,acting as a Neurosurgeon for [employer], PA-C.,have documented all relevant documentation on the behalf of Alfredia Ferguson, PA-C,as directed by  Alfredia Ferguson, PA-C while in the presence of Alfredia Ferguson, PA-C.   Established patient visit   Patient: Phillip Robles   DOB: March 25, 1966   57 y.o. Male  MRN: [id number] Visit Date: 11/10/2022  Today's healthcare provider: Alfredia Ferguson, PA-C   No chief complaint on file.  Subjective    HPI  Diabetes Mellitus Type II, Follow-up  Lab Results  Component Value Date   HGBA1C 7.3 (A) 07/12/2022   HGBA1C 6.9 (A) 03/11/2022   HGBA1C 7.8 (A) 12/09/2021   Wt Readings from Last 3 Encounters:  09/02/22 230 lb (104.3 kg)  07/12/22 238 lb (108 kg)  03/11/22 233 lb (105.7 kg)   Last seen for diabetes 4 months ago.  Management since then includes metformin 1000 mg bid, jardiance 25 mg. He reports {excellent/good/fair/poor:19665} compliance with treatment. He {is/is not:21021397} having side effects. {document side effects if present:1} Symptoms: {Yes/No:20286} fatigue {Yes/No:20286} foot ulcerations  {Yes/No:20286} appetite changes {Yes/No:20286} nausea  {Yes/No:20286} paresthesia of the feet  {Yes/No:20286} polydipsia  {Yes/No:20286} polyuria {Yes/No:20286} visual disturbances   {Yes/No:20286} vomiting     Home blood sugar records: {diabetes glucometry results:16657}  Episodes of hypoglycemia? {Yes/No:20286} {enter symptoms and frequency of symptoms if yes:1}   Current insulin regiment: {enter 'none' or type of insulin and number of units taken with each dose of each insulin formulation that the patient is taking:1} Most Recent Eye Exam: *** {Current exercise:16438:::1} {Current diet habits:16563:::1}  Pertinent Labs: Lab Results  Component Value Date   CHOL 116 09/03/2021   HDL 51 09/03/2021   LDLCALC 48 09/03/2021   TRIG 90 09/03/2021   CHOLHDL 2.3 09/03/2021   Lab Results  Component Value Date   NA 140  03/11/2022   K 4.4 03/11/2022   CREATININE 1.03 03/11/2022   EGFR 85 03/11/2022   LABMICR <3.0 12/09/2021   MICRALBCREAT <6 12/09/2021     ---------------------------------------------------------------------------------------------------   Medications: Outpatient Medications Prior to Visit  Medication Sig   aspirin EC 81 MG tablet Take 81 mg by mouth daily. Swallow whole.   atorvastatin (LIPITOR) 80 MG tablet Take 1 tablet (80 mg total) by mouth daily.   Continuous Blood Gluc Sensor (FREESTYLE LIBRE 2 SENSOR) MISC Use to check blood sugar continuously replace every 14 days   ENTRESTO 24-26 MG TAKE 1 TABLET BY MOUTH TWICE DAILY   ezetimibe (ZETIA) 10 MG tablet Take 1 tablet (10 mg total) by mouth daily.   JARDIANCE 25 MG TABS tablet TAKE 1 TABLET BY MOUTH ONCE DAILY   metFORMIN (GLUCOPHAGE) 1000 MG tablet TAKE 1 TABLET BY MOUTH TWICE DAILY   metoprolol succinate (TOPROL-XL) 25 MG 24 hr tablet TAKE 1 TABLET BY MOUTH TWICE DAILY   timolol (BETIMOL) 0.25 % ophthalmic solution    timolol (TIMOPTIC) 0.5 % ophthalmic solution SMARTSIG:In Eye(s)   No facility-administered medications prior to visit.    Review of Systems  {Labs  Heme  Chem  Endocrine  Serology  Results Review (optional):23779}   Objective    There were no vitals taken for this visit. {Show previous Marijo Quizon signs (optional):23777}  Physical Exam  ***  No results found for any visits on 11/10/22.  Assessment & Plan     ***  No follow-ups on file.      {provider attestation***:1}   Alfredia Ferguson, PA-C  [facility] [phone] (phone) [phone] (  fax)  [place]

## 2022-11-10 ENCOUNTER — Ambulatory Visit (INDEPENDENT_AMBULATORY_CARE_PROVIDER_SITE_OTHER): Payer: BC Managed Care – PPO | Admitting: Physician Assistant

## 2022-11-10 ENCOUNTER — Encounter: Payer: Self-pay | Admitting: Physician Assistant

## 2022-11-10 ENCOUNTER — Other Ambulatory Visit: Payer: Self-pay | Admitting: Internal Medicine

## 2022-11-10 VITALS — BP 108/80 | HR 106 | Temp 98.6°F | Resp 15 | Ht 75.0 in | Wt 237.3 lb

## 2022-11-10 DIAGNOSIS — E1142 Type 2 diabetes mellitus with diabetic polyneuropathy: Secondary | ICD-10-CM

## 2022-11-10 DIAGNOSIS — R748 Abnormal levels of other serum enzymes: Secondary | ICD-10-CM

## 2022-11-10 LAB — POCT GLYCOSYLATED HEMOGLOBIN (HGB A1C): Hemoglobin A1C: 7 % — AB (ref 4.0–5.6)

## 2022-11-10 NOTE — Assessment & Plan Note (Addendum)
A1c today 7.0%, last visit 7.3% Manages with metformin 1000 mg BID and jardiance 25 mg  Failed victoza, trulicity 2/2 SE  Pt making good progression despite other health concerns  On statin, ace/arb Sees podiatry, Rt foot in boot F/u 6 mo

## 2022-11-10 NOTE — Progress Notes (Signed)
Patient for US guided Liver Biopsy on Thurs 11/11/2022, I called and spoke with the patient on the phone and gave pre-procedure instructions. Pt was made aware to be here at 9a last dose of ASA 81mg  on Sat 11/06/2022, NPO after MN prior to procedure as well as driver post procedure/recovery/discharge. Pt stated understanding.  Called 11/10/2022

## 2022-11-11 ENCOUNTER — Ambulatory Visit
Admission: RE | Admit: 2022-11-11 | Discharge: 2022-11-11 | Disposition: A | Discharge status: Home / Self Care | Payer: BC Managed Care – PPO | Source: Ambulatory Visit | Attending: Student | Admitting: Student

## 2022-11-11 ENCOUNTER — Other Ambulatory Visit: Payer: Self-pay

## 2022-11-11 ENCOUNTER — Other Ambulatory Visit: Payer: Self-pay | Admitting: Interventional Radiology

## 2022-11-11 ENCOUNTER — Ambulatory Visit
Admission: RE | Admit: 2022-11-11 | Discharge: 2022-11-11 | Disposition: A | Discharge status: Home / Self Care | Payer: BC Managed Care – PPO | Source: Ambulatory Visit | Attending: Gastroenterology | Admitting: Gastroenterology

## 2022-11-11 VITALS — BP 100/85 | HR 86 | Temp 97.7°F | Resp 12 | Ht 76.0 in | Wt 230.0 lb

## 2022-11-11 DIAGNOSIS — I9581 Postprocedural hypotension: Secondary | ICD-10-CM | POA: Insufficient documentation

## 2022-11-11 DIAGNOSIS — E119 Type 2 diabetes mellitus without complications: Secondary | ICD-10-CM | POA: Insufficient documentation

## 2022-11-11 DIAGNOSIS — R748 Abnormal levels of other serum enzymes: Secondary | ICD-10-CM | POA: Insufficient documentation

## 2022-11-11 DIAGNOSIS — R7989 Other specified abnormal findings of blood chemistry: Secondary | ICD-10-CM

## 2022-11-11 DIAGNOSIS — I10 Essential (primary) hypertension: Secondary | ICD-10-CM | POA: Insufficient documentation

## 2022-11-11 DIAGNOSIS — K2950 Unspecified chronic gastritis without bleeding: Secondary | ICD-10-CM | POA: Insufficient documentation

## 2022-11-11 DIAGNOSIS — I252 Old myocardial infarction: Secondary | ICD-10-CM | POA: Diagnosis not present

## 2022-11-11 DIAGNOSIS — I8500 Esophageal varices without bleeding: Secondary | ICD-10-CM

## 2022-11-11 DIAGNOSIS — D649 Anemia, unspecified: Secondary | ICD-10-CM | POA: Diagnosis not present

## 2022-11-11 DIAGNOSIS — K7460 Unspecified cirrhosis of liver: Secondary | ICD-10-CM | POA: Diagnosis not present

## 2022-11-11 DIAGNOSIS — R109 Unspecified abdominal pain: Secondary | ICD-10-CM | POA: Diagnosis not present

## 2022-11-11 LAB — CBC WITH DIFFERENTIAL/PLATELET
Abs Immature Granulocytes: 0.01 10*3/uL (ref 0.00–0.07)
Basophils Absolute: 0.1 10*3/uL (ref 0.0–0.1)
Basophils Relative: 1 %
Eosinophils Absolute: 0.2 10*3/uL (ref 0.0–0.5)
Eosinophils Relative: 3 %
HCT: 42.8 % (ref 39.0–52.0)
Hemoglobin: 14.1 g/dL (ref 13.0–17.0)
Immature Granulocytes: 0 %
Lymphocytes Relative: 20 %
Lymphs Abs: 0.9 10*3/uL (ref 0.7–4.0)
MCH: 29.1 pg (ref 26.0–34.0)
MCHC: 32.9 g/dL (ref 30.0–36.0)
MCV: 88.4 fL (ref 80.0–100.0)
Monocytes Absolute: 0.3 10*3/uL (ref 0.1–1.0)
Monocytes Relative: 7 %
Neutro Abs: 3.1 10*3/uL (ref 1.7–7.7)
Neutrophils Relative %: 69 %
Platelets: 120 10*3/uL — ABNORMAL LOW (ref 150–400)
RBC: 4.84 MIL/uL (ref 4.22–5.81)
RDW: 14 % (ref 11.5–15.5)
WBC: 4.5 10*3/uL (ref 4.0–10.5)
nRBC: 0 % (ref 0.0–0.2)

## 2022-11-11 LAB — COMPREHENSIVE METABOLIC PANEL
ALT: 24 U/L (ref 0–44)
AST: 28 U/L (ref 15–41)
Albumin: 4.5 g/dL (ref 3.5–5.0)
Alkaline Phosphatase: 94 U/L (ref 38–126)
Anion gap: 10 (ref 5–15)
BUN: 22 mg/dL — ABNORMAL HIGH (ref 6–20)
CO2: 23 mmol/L (ref 22–32)
Calcium: 10.3 mg/dL (ref 8.9–10.3)
Chloride: 105 mmol/L (ref 98–111)
Creatinine, Ser: 0.92 mg/dL (ref 0.61–1.24)
GFR, Estimated: >60 mL/min (ref >60)
Glucose, Bld: 150 mg/dL — ABNORMAL HIGH (ref 70–99)
Potassium: 4.1 mmol/L (ref 3.5–5.1)
Sodium: 138 mmol/L (ref 135–145)
Total Bilirubin: 1 mg/dL (ref 0.3–1.2)
Total Protein: 7.4 g/dL (ref 6.5–8.1)

## 2022-11-11 LAB — GLUCOSE, CAPILLARY
Glucose-Capillary: 135 mg/dL — ABNORMAL HIGH (ref 70–99)
Glucose-Capillary: 123 mg/dL — ABNORMAL HIGH (ref 70–99)

## 2022-11-11 LAB — PROTIME-INR
INR: 1 (ref 0.8–1.2)
Prothrombin Time: 13.4 seconds (ref 11.4–15.2)

## 2022-11-11 MED ORDER — LIDOCAINE HCL (PF) 1 % IJ SOLN
10.0000 mL | Freq: Once | INTRAMUSCULAR | Status: AC
  Administered 2022-11-11: 10 mL via INTRADERMAL

## 2022-11-11 MED ORDER — FENTANYL CITRATE (PF) 100 MCG/2ML IJ SOLN
INTRAMUSCULAR | Status: AC | PRN
  Administered 2022-11-11 (×2): 50 ug via INTRAVENOUS

## 2022-11-11 MED ORDER — MIDAZOLAM HCL 2 MG/2ML IJ SOLN
INTRAMUSCULAR | Status: AC
  Filled 2022-11-11: qty 2

## 2022-11-11 MED ORDER — SODIUM CHLORIDE 0.9 % IV SOLN: INTRAVENOUS | Status: DC

## 2022-11-11 MED ORDER — MIDAZOLAM HCL 5 MG/5ML IJ SOLN
INTRAMUSCULAR | Status: AC | PRN
  Administered 2022-11-11 (×2): 1 mg via INTRAVENOUS

## 2022-11-11 MED ORDER — FENTANYL CITRATE (PF) 100 MCG/2ML IJ SOLN
INTRAMUSCULAR | Status: AC
  Filled 2022-11-11: qty 2

## 2022-11-11 NOTE — Procedures (Signed)
Interventional Radiology Procedure Note  Date of Procedure: 11/11/2022  Procedure: [place] random liver core needle biopsy   Findings:  1. [place] random liver core needle biopsy right lobe 18ga x2  2. Hemostasis with Gelfoam slurry    Complications: No immediate complications noted.   Estimated Blood Loss: minimal  Follow-up and Recommendations: 1. Bedrest 2 hours    Olive Bass, MD  Vascular & Interventional Radiology  11/11/2022 11:03 AM

## 2022-11-11 NOTE — Progress Notes (Addendum)
BP running a little low, provider notified.  U/S ordered.

## 2022-11-11 NOTE — H&P (Signed)
Chief Complaint: Patient was seen in consultation today for elevated liver enzymes  Referring Physician(s): Ardelia Mems  Supervising Physician: Pernell Dupre  Patient Status: [facility] - Out-pt  History of Present Illness: Phillip Robles is a 57 y.o. male with PMH significant for anemia, chronic gastritis, diabetes mellitus, hypertension, and myocardial infarction being seen today in relation to elevated liver enzymes. The patient is being followed by Tawni Pummel, PA-C from [facility] who has ordered image-guided liver biopsy to further evaluate patient's elevated LFTs.   Past Medical History:  Diagnosis Date   Anemia    Blood transfusion without reported diagnosis    Chronic gastritis w/ Metaplasia    Diabetes mellitus without complication (HCC)    Hypertension    Metaplasia of esophagus    Myocardial infarction [facility])     Past Surgical History:  Procedure Laterality Date   CARDIAC CATHETERIZATION     CORONARY ARTERY BYPASS GRAFT     ESOPHAGOGASTRODUODENOSCOPY (EGD) WITH PROPOFOL N/A 02/16/2022   Procedure: ESOPHAGOGASTRODUODENOSCOPY (EGD) WITH PROPOFOL;  Surgeon: Midge Minium, MD;  Location: [facility] ENDOSCOPY;  Service: Endoscopy;  Laterality: N/A;   ESOPHAGOGASTRODUODENOSCOPY (EGD) WITH PROPOFOL N/A 09/20/2022   Procedure: ESOPHAGOGASTRODUODENOSCOPY (EGD) WITH PROPOFOL;  Surgeon: Regis Bill, MD;  Location: [facility] ENDOSCOPY;  Service: Endoscopy;  Laterality: N/A;   thumb surgery      Allergies: Other  Medications: Prior to Admission medications   Medication Sig Start Date End Date Taking? Authorizing Provider  aspirin EC 81 MG tablet Take 81 mg by mouth daily. Swallow whole.    [provider]  atorvastatin (LIPITOR) 80 MG tablet Take 1 tablet (80 mg total) by mouth daily. 09/02/22   Debbe Odea, MD  carvedilol (COREG) 3.125 MG tablet Take 3.125 mg by mouth 2 (two) times daily with a meal. 09/23/22   [provider]  Continuous Blood  Gluc Sensor (FREESTYLE LIBRE 2 SENSOR) MISC Use to check blood sugar continuously replace every 14 days 03/11/22   Alfredia Ferguson, PA-C  ENTRESTO 24-26 MG TAKE 1 TABLET BY MOUTH TWICE DAILY 09/02/22   Alfredia Ferguson, PA-C  ezetimibe (ZETIA) 10 MG tablet Take 1 tablet (10 mg total) by mouth daily. 09/02/22   Debbe Odea, MD  JARDIANCE 25 MG TABS tablet TAKE 1 TABLET BY MOUTH ONCE DAILY 10/18/22   Ok Edwards, Lillia Abed, PA-C  metFORMIN (GLUCOPHAGE) 1000 MG tablet TAKE 1 TABLET BY MOUTH TWICE DAILY 11/03/22   Drubel, Lillia Abed, PA-C  timolol (BETIMOL) 0.25 % ophthalmic solution     [provider]  timolol (TIMOPTIC) 0.5 % ophthalmic solution SMARTSIG:In Eye(s) 04/11/21   [provider]     Family History  Problem Relation Age of Onset   Heart Problems Mother    Heart Problems Father    Bipolar disorder Sister    COPD Maternal Uncle    Heart Problems Paternal Uncle    Cancer Maternal Grandmother    Heart Problems Paternal Grandmother    Heart Problems Paternal Grandfather     Social History   Socioeconomic History   Marital status: Married    Spouse name: Not on file   Number of children: Not on file   Years of education: Not on file   Highest education level: Master's degree (e.g., MA, MS, MEng, MEd, MSW, MBA)  Occupational History   Not on file  Tobacco Use   Smoking status: Never   Smokeless tobacco: Former    Types: Chew    Quit date: 03/2019  Vaping Use  Vaping Use: Never used  Substance and Sexual Activity   Alcohol use: Not Currently    Comment: Quit 07/2019   Drug use: Never   Sexual activity: Yes  Other Topics Concern   Not on file  Social History Narrative   Not on file   Social Determinants of Health   Financial Resource Strain: Low Risk  (11/09/2022)   Overall Financial Resource Strain (CARDIA)    Difficulty of Paying Living Expenses: Not hard at all  Food Insecurity: No Food Insecurity (11/09/2022)   Hunger Vital Sign    Worried About  Running Out of Food in the Last Year: Never true    Ran Out of Food in the Last Year: Never true  Transportation Needs: No Transportation Needs (11/09/2022)   PRAPARE - Administrator, Civil Service (Medical): No    Lack of Transportation (Non-Medical): No  Physical Activity: Insufficiently Active (11/09/2022)   Exercise Vital Sign    Days of Exercise per Week: 2 days    Minutes of Exercise per Session: 30 min  Stress: No Stress Concern Present (11/09/2022)   [employer] of Occupational Health - Occupational Stress Questionnaire    Feeling of Stress : Only a little  Social Connections: Moderately Integrated (11/09/2022)   Social Connection and Isolation Panel [NHANES]    Frequency of Communication with Friends and Family: More than three times a week    Frequency of Social Gatherings with Friends and Family: Once a week    Attends Religious Services: 1 to 4 times per year    Active Member of [employer] or Organizations: No    Attends Engineer, structural: Not on file    Marital Status: Married    Code Status: Full Code  Review of Systems: A 12 point ROS discussed and pertinent positives are indicated in the HPI above.  All other systems are negative.  Review of Systems  Constitutional:  Negative for chills and fever.  Respiratory:  Negative for chest tightness and shortness of breath.   Cardiovascular:  Negative for chest pain and leg swelling.  Gastrointestinal:  Negative for abdominal pain, diarrhea, nausea and vomiting.  Neurological:  Negative for dizziness and headaches.  Psychiatric/Behavioral:  Negative for confusion.     Vital Signs: BP 109/86   Temp 97.7 F (36.5 C) (Oral)   Resp 18   Ht 6\' 4"  (1.93 m)   Wt 230 lb (104.3 kg)   SpO2 99%   BMI 28.00 kg/m     Physical Exam Vitals reviewed.  Constitutional:      General: He is not in acute distress.    Appearance: He is not ill-appearing.  Cardiovascular:     Rate and Rhythm: Normal rate  and regular rhythm.     Pulses: Normal pulses.     Heart sounds: Normal heart sounds.  Pulmonary:     Effort: Pulmonary effort is normal.     Breath sounds: Normal breath sounds.  Abdominal:     Palpations: Abdomen is soft.     Tenderness: There is no abdominal tenderness.  Musculoskeletal:     Right lower leg: No edema.     Left lower leg: No edema.  Skin:    General: Skin is warm and dry.  Neurological:     Mental Status: He is alert and oriented to person, place, and time.  Psychiatric:        Mood and Affect: Mood normal.        Behavior: Behavior  normal.        Thought Content: Thought content normal.        Judgment: Judgment normal.     Imaging: No results found.  Labs:  CBC: Recent Labs    03/11/22 1011  WBC 5.7  HGB 14.1  HCT 42.0  PLT 117*    COAGS: No results for input(s): "INR", "APTT" in the last 8760 hours.  BMP: Recent Labs    12/09/21 0000 03/11/22 1011  NA  --  140  K  --  4.4  CL  --  102  CO2  --  25  GLUCOSE CANCELED 148*  BUN  --  15  CALCIUM  --  10.1  CREATININE  --  1.03    LIVER FUNCTION TESTS: Recent Labs    03/11/22 1011  BILITOT 1.7*  AST 40  ALT 43  ALKPHOS 192*  PROT 6.6  ALBUMIN 4.6    TUMOR MARKERS: No results for input(s): "AFPTM", "CEA", "CA199", "CHROMGRNA" in the last 8760 hours.  Assessment and Plan:  Phillip Robles is a 57 yo male presenting today for evaluation of elevated LFTs. He is being followed by Tawni Pummel PA-C from [facility]. Patient was referred to IR for image-guided liver biopsy to further evaluate his elevated LFTs. Case tentatively scheduled to proceed on 11/11/22. Patient is NPO and had last dose of aspirin on 11/06/22. Pre-procedural labs are pending.  Risks and benefits of image-guided liver biopsy was discussed with the patient and/or patient's family including, but not limited to bleeding, infection, damage to adjacent structures or low yield requiring additional tests.  All of the  questions were answered and there is agreement to proceed.  Consent signed and in chart.   Thank you for this interesting consult.  I greatly enjoyed meeting Maxim Bedel and look forward to participating in their care.  A copy of this report was sent to the requesting provider on this date.  Electronically Signed: Kennieth Francois, PA-C 11/11/2022, 9:34 AM   I spent a total of  15 Minutes   in face to face in clinical consultation, greater than 50% of which was counseling/coordinating care for elevated liver enzymes.

## 2022-11-11 NOTE — Progress Notes (Signed)
Patient clinically stable post US Liver biopsy per Dr Juliette Alcide, tolerated well. Vitals stable pre and post procedure. Received Versed 2 mg along with Fentanyl 100 mcg IV for procedure. Report given to [place] Rn post procedure/specials/10. Update given to wife post procedure/recovery.

## 2022-12-01 DIAGNOSIS — M14671 Charcot's joint, right ankle and foot: Secondary | ICD-10-CM | POA: Diagnosis not present

## 2022-12-08 DIAGNOSIS — M14671 Charcot's joint, right ankle and foot: Secondary | ICD-10-CM | POA: Diagnosis not present

## 2023-02-04 ENCOUNTER — Other Ambulatory Visit: Payer: Self-pay | Admitting: Physician Assistant

## 2023-02-04 DIAGNOSIS — I252 Old myocardial infarction: Secondary | ICD-10-CM

## 2023-03-09 ENCOUNTER — Encounter: Payer: Self-pay | Admitting: Cardiology

## 2023-03-09 DIAGNOSIS — M14671 Charcot's joint, right ankle and foot: Secondary | ICD-10-CM | POA: Diagnosis not present

## 2023-03-09 NOTE — Telephone Encounter (Signed)
Made in error

## 2023-05-03 DIAGNOSIS — Z0100 Encounter for examination of eyes and vision without abnormal findings: Secondary | ICD-10-CM | POA: Diagnosis not present

## 2023-05-03 DIAGNOSIS — H4010X0 Unspecified open-angle glaucoma, stage unspecified: Secondary | ICD-10-CM | POA: Diagnosis not present

## 2023-05-03 DIAGNOSIS — E113313 Type 2 diabetes mellitus with moderate nonproliferative diabetic retinopathy with macular edema, bilateral: Secondary | ICD-10-CM | POA: Diagnosis not present

## 2023-05-03 LAB — HM DIABETES EYE EXAM

## 2023-05-06 ENCOUNTER — Other Ambulatory Visit: Payer: Self-pay | Admitting: Physician Assistant

## 2023-05-06 DIAGNOSIS — E1142 Type 2 diabetes mellitus with diabetic polyneuropathy: Secondary | ICD-10-CM

## 2023-05-12 ENCOUNTER — Encounter: Payer: BC Managed Care – PPO | Admitting: Family Medicine

## 2023-05-23 ENCOUNTER — Ambulatory Visit (INDEPENDENT_AMBULATORY_CARE_PROVIDER_SITE_OTHER): Payer: BC Managed Care – PPO | Admitting: Family Medicine

## 2023-05-23 VITALS — BP 110/79 | HR 84 | Temp 98.5°F | Ht 76.0 in | Wt 250.0 lb

## 2023-05-23 DIAGNOSIS — R5382 Chronic fatigue, unspecified: Secondary | ICD-10-CM

## 2023-05-23 DIAGNOSIS — Z0001 Encounter for general adult medical examination with abnormal findings: Secondary | ICD-10-CM

## 2023-05-23 DIAGNOSIS — M7501 Adhesive capsulitis of right shoulder: Secondary | ICD-10-CM

## 2023-05-23 DIAGNOSIS — E1142 Type 2 diabetes mellitus with diabetic polyneuropathy: Secondary | ICD-10-CM

## 2023-05-23 DIAGNOSIS — Z0000 Encounter for general adult medical examination without abnormal findings: Secondary | ICD-10-CM | POA: Insufficient documentation

## 2023-05-23 DIAGNOSIS — D696 Thrombocytopenia, unspecified: Secondary | ICD-10-CM | POA: Insufficient documentation

## 2023-05-23 DIAGNOSIS — Z79899 Other long term (current) drug therapy: Secondary | ICD-10-CM

## 2023-05-23 DIAGNOSIS — E1169 Type 2 diabetes mellitus with other specified complication: Secondary | ICD-10-CM | POA: Diagnosis not present

## 2023-05-23 DIAGNOSIS — R4189 Other symptoms and signs involving cognitive functions and awareness: Secondary | ICD-10-CM

## 2023-05-23 DIAGNOSIS — M14679 Charcot's joint, unspecified ankle and foot: Secondary | ICD-10-CM

## 2023-05-23 DIAGNOSIS — E1159 Type 2 diabetes mellitus with other circulatory complications: Secondary | ICD-10-CM

## 2023-05-23 DIAGNOSIS — I152 Hypertension secondary to endocrine disorders: Secondary | ICD-10-CM

## 2023-05-23 DIAGNOSIS — I252 Old myocardial infarction: Secondary | ICD-10-CM

## 2023-05-23 DIAGNOSIS — Z862 Personal history of diseases of the blood and blood-forming organs and certain disorders involving the immune mechanism: Secondary | ICD-10-CM

## 2023-05-23 DIAGNOSIS — R197 Diarrhea, unspecified: Secondary | ICD-10-CM

## 2023-05-23 NOTE — Assessment & Plan Note (Signed)
Patient reports Hgb level as low as 5 approx. 4 years ago. Severe anemia four years ago requiring iron transfusions (per patient, hemoglobin reported to be as low as 5). Currently monitoring for signs of recurrence. Discussed importance of regular monitoring. - Order CBC to monitor hemoglobin levels

## 2023-05-23 NOTE — Assessment & Plan Note (Signed)
   Last PSA 0.5 on 09/03/21

## 2023-05-23 NOTE — Progress Notes (Signed)
Complete physical exam   Patient: Phillip Robles   DOB: Jun 04, 1965   57 y.o. Male  MRN: [id number] Visit Date: 05/23/2023  Today's healthcare provider: Sherlyn Hay, DO   Chief Complaint  Patient presents with   Annual Exam   Subjective    Phillip Robles is a 57 y.o. male who presents today for a complete physical exam.  He reports consuming a  low carb  diet. The patient does not participate in regular exercise at present. He generally feels fair. He reports sleeping fairly well. He does have additional problems to discuss today.  HPI HPI   Patient wants referral to new cardiologist Patient would like to discuss testosterone Last edited by Adline Peals, CMA on 05/23/2023 10:51 AM.      The patient, a 57 year old with a history of diabetes and heart disease, presents for a routine physical exam. The patient reports a significant decrease in activity level over the past year due to a diagnosis of Charcot foot, which required four months in a cast and four months in a boot. This decrease in activity has led to weight gain and a presumed increase in his A1c levels.  The patient's cardiac condition appears stable, with no reported limitations. However, he expresses dissatisfaction with his current cardiologist and wishes to change providers. The patient has a history of multiple heart attacks, with the most recent one occurring in 2019, followed by a bypass surgery (05/25/2018).  The patient also reports a "mystery" lower digestive issue that has been investigated by multiple doctors without a definitive diagnosis. Symptoms include alternating diarrhea and constipation, cramping, and excessive gas. The patient has tried various dietary changes and over-the-counter remedies with limited success.  The patient has a history of liver abnormalities, which are currently under investigation, with a follow-up appointment scheduled for January. He also reports a previously diagnosed frozen  shoulder, which has improved with self-administered physical therapy involving hanging from a bar replaced in his bathroom doorway for 30 seconds at a time every time he passes it. From record: Liver biopsy showed likely MAFLD  From record: On coreg rather than metoprolol for more variceal protection (s/p EGD 08/2022, which showed "grade 2 esophageal varices. Portal hypertensive gastropathy. Normal duodenum. Pathology with portal hypertensive gastropathy and patchy intestinal metaplasia")  The patient has a history of significant weight loss, from 460 pounds to a stable weight of around 228 pounds. However, due to decreased activity levels this year, the patient's weight has increased to 250 pounds. The patient also reports a decrease in functional strength and mental sharpness since his bypass surgery, which he attributes to his diabetes and heart disease.  The patient has a history of neuropathy, which he believes may be affecting his gut, contributing to his digestive issues. 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 The patient manages his foot health by monitoring swelling and adjusting activity levels accordingly.  The patient expresses interest in testosterone therapy, as his brother has reported positive results from such treatment. He reports symptoms of fatigue, mental fogginess, and difficulty gaining muscle, which he believes may be due to low testosterone levels. The patient acknowledges the need for better diabetes management and expresses a commitment to improving his health.   Past Medical History:  Diagnosis Date   Anemia    Blood transfusion without reported diagnosis    Chronic gastritis w/ Metaplasia    Diabetes mellitus without  complication (HCC)    Hypertension    Metaplasia of esophagus    Myocardial infarction [facility])    Past Surgical History:  Procedure Laterality Date   CARDIAC CATHETERIZATION     CORONARY  ARTERY BYPASS GRAFT     ESOPHAGOGASTRODUODENOSCOPY (EGD) WITH PROPOFOL N/A 02/16/2022   Procedure: ESOPHAGOGASTRODUODENOSCOPY (EGD) WITH PROPOFOL;  Surgeon: Midge Minium, MD;  Location: [facility] ENDOSCOPY;  Service: Endoscopy;  Laterality: N/A;   ESOPHAGOGASTRODUODENOSCOPY (EGD) WITH PROPOFOL N/A 09/20/2022   Procedure: ESOPHAGOGASTRODUODENOSCOPY (EGD) WITH PROPOFOL;  Surgeon: Regis Bill, MD;  Location: [facility] ENDOSCOPY;  Service: Endoscopy;  Laterality: N/A;   thumb surgery     Social History   Socioeconomic History   Marital status: Married    Spouse name: Not on file   Number of children: Not on file   Years of education: Not on file   Highest education level: Master's degree (e.g., MA, MS, MEng, MEd, MSW, MBA)  Occupational History   Not on file  Tobacco Use   Smoking status: Never   Smokeless tobacco: Former    Types: Chew    Quit date: 03/2019  Vaping Use   Vaping status: Never Used  Substance and Sexual Activity   Alcohol use: Not Currently    Comment: Quit 07/2019   Drug use: Never   Sexual activity: Yes  Other Topics Concern   Not on file  Social History Narrative   Not on file   Social Drivers of Health   Financial Resource Strain: Low Risk  (05/22/2023)   Overall Financial Resource Strain (CARDIA)    Difficulty of Paying Living Expenses: Not hard at all  Food Insecurity: No Food Insecurity (05/22/2023)   Hunger Vital Sign    Worried About Running Out of Food in the Last Year: Never true    Ran Out of Food in the Last Year: Never true  Transportation Needs: No Transportation Needs (05/22/2023)   PRAPARE - Administrator, Civil Service (Medical): No    Lack of Transportation (Non-Medical): No  Physical Activity: Insufficiently Active (05/22/2023)   Exercise Vital Sign    Days of Exercise per Week: 3 days    Minutes of Exercise per Session: 40 min  Stress: Stress Concern Present (05/22/2023)   [employer] of Occupational Health -  Occupational Stress Questionnaire    Feeling of Stress : To some extent  Social Connections: Moderately Isolated (05/22/2023)   Social Connection and Isolation Panel [NHANES]    Frequency of Communication with Friends and Family: Three times a week    Frequency of Social Gatherings with Friends and Family: Twice a week    Attends Religious Services: Never    Database administrator or Organizations: No    Attends Engineer, structural: Not on file    Marital Status: Married  Catering manager Violence: Not on file   Family Status  Relation Name Status   Mother  Deceased   Father  Alive   Sister  Alive   Brother  Alive   Mat Uncle  Other   Pat Uncle  Other   MGM  Deceased   PGM  Deceased   PGF  Deceased  No partnership data on file   Family History  Problem Relation Age of Onset   Heart Problems Mother    Heart Problems Father    Bipolar disorder Sister    COPD Maternal Uncle    Heart Problems Paternal Uncle    Cancer Maternal Grandmother  Heart Problems Paternal Grandmother    Heart Problems Paternal Grandfather    Allergies  Allergen Reactions   Other     Mild pet and pollen    Patient Care Team: Sherlyn Hay, DO as PCP - General (Family Medicine) Debbe Odea, MD as PCP - Cardiology (Cardiology)   Medications: Outpatient Medications Prior to Visit  Medication Sig   aspirin EC 81 MG tablet Take 81 mg by mouth daily. Swallow whole.   atorvastatin (LIPITOR) 80 MG tablet Take 1 tablet (80 mg total) by mouth daily.   carvedilol (COREG) 3.125 MG tablet Take 3.125 mg by mouth 2 (two) times daily with a meal.   Continuous Blood Gluc Sensor (FREESTYLE LIBRE 2 SENSOR) MISC Use to check blood sugar continuously replace every 14 days   ENTRESTO 24-26 MG TAKE 1 TABLET BY MOUTH TWICE DAILY   ezetimibe (ZETIA) 10 MG tablet Take 1 tablet (10 mg total) by mouth daily.   JARDIANCE 25 MG TABS tablet TAKE 1 TABLET BY MOUTH ONCE DAILY   metFORMIN (GLUCOPHAGE) 1000  MG tablet TAKE 1 TABLET BY MOUTH TWICE DAILY   timolol (TIMOPTIC) 0.5 % ophthalmic solution SMARTSIG:In Eye(s)   [DISCONTINUED] timolol (BETIMOL) 0.25 % ophthalmic solution  (Patient not taking: Reported on 05/23/2023)   No facility-administered medications prior to visit.    Review of Systems  Constitutional:  Negative for appetite change, chills, fatigue and fever.  HENT:  Negative for congestion, ear pain, hearing loss, nosebleeds and trouble swallowing.   Eyes:  Negative for pain and visual disturbance.  Respiratory:  Negative for cough, chest tightness and shortness of breath.   Cardiovascular:  Negative for chest pain, palpitations and leg swelling.  Gastrointestinal:  Positive for abdominal pain (intermittent), constipation (intermittent) and diarrhea (intermitent). Negative for blood in stool, nausea and vomiting.  Endocrine: Negative for polydipsia, polyphagia and polyuria.  Genitourinary:  Negative for dysuria and flank pain.  Musculoskeletal:  Negative for arthralgias, back pain, joint swelling, myalgias and neck stiffness.  Skin:  Negative for color change, rash and wound.  Neurological:  Negative for dizziness, tremors, seizures, speech difficulty, weakness, light-headedness and headaches.  Psychiatric/Behavioral:  Negative for behavioral problems, confusion, decreased concentration, dysphoric mood and sleep disturbance. The patient is not nervous/anxious.   All other systems reviewed and are negative.      Objective    BP 110/79 (BP Location: Left Arm, Patient Position: Sitting, Cuff Size: Large)   Pulse 84   Temp 98.5 F (36.9 C) (Oral)   Ht 6\' 4"  (1.93 m)   Wt 250 lb (113.4 kg)   SpO2 99%   BMI 30.43 kg/m    Physical Exam Vitals and nursing note reviewed.  Constitutional:      General: He is awake.     Appearance: Normal appearance.  HENT:     Head: Normocephalic and atraumatic.     Right Ear: Tympanic membrane, ear canal and external ear normal.     Left  Ear: Tympanic membrane, ear canal and external ear normal.     Nose: Nose normal.     Mouth/Throat:     Mouth: Mucous membranes are moist.     Pharynx: Oropharynx is clear. No oropharyngeal exudate or posterior oropharyngeal erythema.  Eyes:     General: No scleral icterus.    Extraocular Movements: Extraocular movements intact.     Conjunctiva/sclera: Conjunctivae normal.     Pupils: Pupils are equal, round, and reactive to light.  Neck:     Thyroid:  No thyromegaly or thyroid tenderness.  Cardiovascular:     Rate and Rhythm: Normal rate and regular rhythm.     Pulses: Normal pulses.          Dorsalis pedis pulses are 2+ on the right side and 2+ on the left side.       Posterior tibial pulses are 2+ on the right side and 2+ on the left side.     Heart sounds: Normal heart sounds.  Pulmonary:     Effort: Pulmonary effort is normal. No tachypnea, bradypnea or respiratory distress.     Breath sounds: Normal breath sounds. No stridor. No wheezing, rhonchi or rales.  Abdominal:     General: Bowel sounds are normal. There is no distension.     Palpations: Abdomen is soft. There is no mass.     Tenderness: There is no abdominal tenderness. There is no guarding.     Hernia: No hernia is present.  Musculoskeletal:     Cervical back: Normal range of motion and neck supple.     Right lower leg: No edema.     Left lower leg: No edema.     Right foot: Normal range of motion. Charcot foot present. No deformity, bunion, foot drop or prominent metatarsal heads.     Left foot: Normal range of motion. No deformity, bunion, Charcot foot, foot drop or prominent metatarsal heads.       Feet:     Left Lower Extremity: (great toe) Feet:     Right foot:     Protective Sensation: 10 sites tested.  2 sites sensed.     Skin integrity: No ulcer, blister, skin breakdown, erythema, warmth, callus, dry skin or fissure.     Toenail Condition: Right toenails are normal.     Left foot:     Protective  Sensation: 10 sites tested.  3 sites sensed.     Skin integrity: No ulcer, blister, skin breakdown, erythema, warmth, callus, dry skin or fissure.     Toenail Condition: Left toenails are normal.  Lymphadenopathy:     Cervical: No cervical adenopathy.  Skin:    General: Skin is warm and dry.  Neurological:     Mental Status: He is alert and oriented to person, place, and time. Mental status is at baseline.  Psychiatric:        Mood and Affect: Mood normal.        Behavior: Behavior normal.      Last depression screening scores    05/23/2023   10:54 AM 11/10/2022    9:26 AM 07/12/2022    9:06 AM  PHQ 2/9 Scores  PHQ - 2 Score 0 0 0  PHQ- 9 Score 2 1 1    Last fall risk screening    05/23/2023   10:54 AM  Fall Risk   Falls in the past year? 0  Number falls in past yr: 0  Injury with Fall? 0   Last Audit-C alcohol use screening    05/22/2023   12:52 PM  Alcohol Use Disorder Test (AUDIT)  1. How often do you have a drink containing alcohol? 0   A score of 3 or more in women, and 4 or more in men indicates increased risk for alcohol abuse, EXCEPT if all of the points are from question 1   No results found for any visits on 05/23/23.  Assessment & Plan    Routine Health Maintenance and Physical Exam  Exercise Activities and Dietary recommendations  Goals  Regain strength lost after MI     Become functionally (physically) stronger and reduce his brain fog/memory to become mentally sharper again.        Immunization History  Administered Date(s) Administered   Zoster Recombinant(Shingrix) 09/03/2021, 03/11/2022    Health Maintenance  Topic Date Due   Diabetic kidney evaluation - Urine ACR  12/10/2022   HEMOGLOBIN A1C  05/12/2023   COVID-19 Vaccine (1) 06/08/2023 (Originally 06/29/1970)   INFLUENZA VACCINE  08/29/2023 (Originally 12/30/2022)   DTaP/Tdap/Td (1 - Tdap) 05/22/2024 (Originally 06/29/1984)   Pneumococcal Vaccine 35-25 Years old (1 of 2 - PCV)  05/22/2024 (Originally 06/30/1971)   Diabetic kidney evaluation - eGFR measurement  11/11/2023   OPHTHALMOLOGY EXAM  05/02/2024   FOOT EXAM  05/22/2024   Colonoscopy  08/29/2029   Hepatitis C Screening  Completed   HIV Screening  Completed   Zoster Vaccines- Shingrix  Completed   HPV VACCINES  Aged Out    Discussed health benefits of physical activity, and encouraged him to engage in regular exercise appropriate for his age and condition.   Annual physical exam Assessment & Plan: Physical exam overall unremarkable except as noted above. Routine lab work ordered as noted.  Declined flu, Tdap, pneumonia, and COVID vaccines. Last shingles vaccine administered by Dr. Lucilla Edin. Discussed importance of considering vaccines in future visits. - Encourage consideration of vaccines in future visits Last PSA 0.5 on 09/03/21   Type 2 diabetes mellitus with diabetic polyneuropathy, without long-term current use of insulin (HCC) Assessment & Plan: Poorly controlled diabetes with concerns about weight gain and A1c levels. Less active due to Charcot foot. Significant neuropathy and inconsistent blood sugar management this year. Discussed importance of resuming physical activity and dietary modifications post-holidays. 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 No recent chest pain or shortness of breath. Currently on Coreg for varicocele related to liver issues. Dissatisfied with current cardiologist and  interested in switching to Dr. Lewie Loron. - Request switch to Dr. Lewie Loron at cardiology clinic - Continue current medications and monitor for new symptoms   History of anemia Assessment & Plan: Patient reports Hgb level as low as 5 approx. 4 years ago. Severe anemia four years ago requiring iron transfusions (per patient, hemoglobin reported to be as low as 5). Currently monitoring for signs of recurrence. Discussed importance of regular monitoring. - Order CBC to monitor hemoglobin levels  Orders: -     CBC  High risk medication use -     Vitamin B12  Brain fog Assessment & Plan: Addressed as noted above.  Orders: -     Testosterone,Free and Total  Chronic fatigue Assessment & Plan: Symptoms of fatigue, mental fogginess, and difficulty gaining muscle. Interested in exploring testosterone therapy based on brother's positive experience. Discussed potential benefits and risks of testosterone therapy, including improved energy and muscle mass versus potential side effects. - Order testosterone levels - Discuss benefits and risks of testosterone therapy based on results  Orders: -  Testosterone,Free and Total  Thrombocytopenia (HCC) Assessment & Plan: Platelets 120 on 11/11/2022. Will recheck today.  Orders: -     CBC  Diarrhea, unspecified type Assessment & Plan: Chronic lower digestive issues including diarrhea, constipation, cramping, and gas. Multiple evaluations with no definitive diagnosis. Symptoms possibly related to neuropathy or liver issues. Discussed monitoring symptoms and dietary triggers. - Continue Miralax and Benefiber - Monitor symptoms and dietary triggers - Continue to follow-up with GI for further evaluation if symptoms persist    Charcot arthropathy of midfoot Assessment & Plan: Charcot foot diagnosed earlier this year. Spent four months in a cast and four months in a boot. Currently, the foot is stable but has reduced activity levels. No significant  pain reported, but there is some discomfort due to a bone pressing against the outside. Significant neuropathy present.  - Monitor for swelling and adjust activity levels - Follow up with podiatry as needed   Adhesive capsulitis of right shoulder Assessment & Plan: Previously treated with an injection providing temporary relief. Improvement noted with dead hanging exercises. Discussed benefits of continuing these exercises. - Continue dead hanging exercises as tolerated - Follow up if symptoms worsen or do not improve    Return in about 6 months (around 11/21/2023) for DM, HTN.     I discussed the assessment and treatment plan with the patient  The patient was provided an opportunity to ask questions and all were answered. The patient agreed with the plan and demonstrated an understanding of the instructions.   The patient was advised to call back or seek an in-person evaluation if the symptoms worsen or if the condition fails to improve as anticipated.  Total time was 60 minutes. That includes chart review before the visit, the actual patient visit, and time spent on documentation after the visit.  This does not include time spent on the physical exam.    Sherlyn Hay, DO  [facility] [phone] (phone) [phone] (fax)  [facility]

## 2023-05-27 ENCOUNTER — Encounter: Payer: Self-pay | Admitting: Family Medicine

## 2023-05-27 DIAGNOSIS — M7501 Adhesive capsulitis of right shoulder: Secondary | ICD-10-CM

## 2023-05-27 DIAGNOSIS — R4189 Other symptoms and signs involving cognitive functions and awareness: Secondary | ICD-10-CM | POA: Insufficient documentation

## 2023-05-27 DIAGNOSIS — R5382 Chronic fatigue, unspecified: Secondary | ICD-10-CM | POA: Insufficient documentation

## 2023-05-27 DIAGNOSIS — R197 Diarrhea, unspecified: Secondary | ICD-10-CM | POA: Insufficient documentation

## 2023-05-27 HISTORY — DX: Adhesive capsulitis of right shoulder: M75.01

## 2023-05-27 NOTE — Assessment & Plan Note (Signed)
Platelets 120 on 11/11/2022. Will recheck today.

## 2023-05-27 NOTE — Assessment & Plan Note (Signed)
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 No recent chest pain or shortness of breath. Currently on Coreg for varicocele related to liver issues. Dissatisfied with current cardiologist and interested in switching to Dr. Lewie Loron. - Request switch to Dr. Lewie Loron at cardiology clinic - Continue current medications and monitor for new symptoms

## 2023-05-27 NOTE — Assessment & Plan Note (Signed)
Charcot foot diagnosed earlier this year. Spent four months in a cast and four months in a boot. Currently, the foot is stable but has reduced activity levels. No significant pain reported, but there is some discomfort due to a bone pressing against the outside. Significant neuropathy present.  - Monitor for swelling and adjust activity levels - Follow up with podiatry as needed

## 2023-05-27 NOTE — Assessment & Plan Note (Signed)
Symptoms of fatigue, mental fogginess, and difficulty gaining muscle. Interested in exploring testosterone therapy based on brother's positive experience. Discussed potential benefits and risks of testosterone therapy, including improved energy and muscle mass versus potential side effects. - Order testosterone levels - Discuss benefits and risks of testosterone therapy based on results

## 2023-05-27 NOTE — Assessment & Plan Note (Signed)
 Addressed as noted above.

## 2023-05-27 NOTE — Assessment & Plan Note (Signed)
Previously treated with an injection providing temporary relief. Improvement noted with dead hanging exercises. Discussed benefits of continuing these exercises. - Continue dead hanging exercises as tolerated - Follow up if symptoms worsen or do not improve

## 2023-05-27 NOTE — Assessment & Plan Note (Signed)
Chronic lower digestive issues including diarrhea, constipation, cramping, and gas. Multiple evaluations with no definitive diagnosis. Symptoms possibly related to neuropathy or liver issues. Discussed monitoring symptoms and dietary triggers. - Continue Miralax and Benefiber - Monitor symptoms and dietary triggers - Continue to follow-up with GI for further evaluation if symptoms persist

## 2023-05-27 NOTE — Assessment & Plan Note (Signed)
Poorly controlled diabetes with concerns about weight gain and A1c levels. Less active due to Charcot foot. Significant neuropathy and inconsistent blood sugar management this year. Discussed importance of resuming physical activity and dietary modifications post-holidays. - Order fasting blood glucose and HbA1c - Encourage resumption of physical activity as tolerated - Discuss dietary modifications post-holidays

## 2023-05-30 DIAGNOSIS — E1159 Type 2 diabetes mellitus with other circulatory complications: Secondary | ICD-10-CM | POA: Diagnosis not present

## 2023-05-30 DIAGNOSIS — Z862 Personal history of diseases of the blood and blood-forming organs and certain disorders involving the immune mechanism: Secondary | ICD-10-CM | POA: Diagnosis not present

## 2023-05-30 DIAGNOSIS — I152 Hypertension secondary to endocrine disorders: Secondary | ICD-10-CM | POA: Diagnosis not present

## 2023-05-30 DIAGNOSIS — E1142 Type 2 diabetes mellitus with diabetic polyneuropathy: Secondary | ICD-10-CM | POA: Diagnosis not present

## 2023-05-30 DIAGNOSIS — Z79899 Other long term (current) drug therapy: Secondary | ICD-10-CM | POA: Diagnosis not present

## 2023-05-30 DIAGNOSIS — E1169 Type 2 diabetes mellitus with other specified complication: Secondary | ICD-10-CM | POA: Diagnosis not present

## 2023-05-31 LAB — MICROALBUMIN / CREATININE URINE RATIO
Creatinine, Urine: 70.1 mg/dL
Microalb/Creat Ratio: 5 mg/g{creat} (ref 0–29)
Microalbumin, Urine: 3.2 ug/mL

## 2023-05-31 LAB — LIPID PANEL
Chol/HDL Ratio: 4.8 {ratio} (ref 0.0–5.0)
Cholesterol, Total: 244 mg/dL — ABNORMAL HIGH (ref 100–199)
HDL: 51 mg/dL (ref >39)
LDL Chol Calc (NIH): 171 mg/dL — ABNORMAL HIGH (ref 0–99)
Triglycerides: 121 mg/dL (ref 0–149)
VLDL Cholesterol Cal: 22 mg/dL (ref 5–40)

## 2023-05-31 LAB — TESTOSTERONE,FREE AND TOTAL
Testosterone, Free: 10 pg/mL (ref 7.2–24.0)
Testosterone: 483 ng/dL (ref 264–916)

## 2023-05-31 LAB — COMPREHENSIVE METABOLIC PANEL
ALT: 25 [IU]/L (ref 0–44)
AST: 23 [IU]/L (ref 0–40)
Albumin: 4.6 g/dL (ref 3.8–4.9)
Alkaline Phosphatase: 111 [IU]/L (ref 44–121)
BUN/Creatinine Ratio: 15 (ref 9–20)
BUN: 16 mg/dL (ref 6–24)
Bilirubin Total: 0.5 mg/dL (ref 0.0–1.2)
CO2: 23 mmol/L (ref 20–29)
Calcium: 9.9 mg/dL (ref 8.7–10.2)
Chloride: 103 mmol/L (ref 96–106)
Creatinine, Ser: 1.08 mg/dL (ref 0.76–1.27)
Globulin, Total: 2 g/dL (ref 1.5–4.5)
Glucose: 155 mg/dL — ABNORMAL HIGH (ref 70–99)
Potassium: 4.2 mmol/L (ref 3.5–5.2)
Sodium: 143 mmol/L (ref 134–144)
Total Protein: 6.6 g/dL (ref 6.0–8.5)
eGFR: 80 mL/min/{1.73_m2} (ref >59)

## 2023-05-31 LAB — HEMOGLOBIN A1C
Est. average glucose Bld gHb Est-mCnc: 180 mg/dL
Hgb A1c MFr Bld: 7.9 % — ABNORMAL HIGH (ref 4.8–5.6)

## 2023-05-31 LAB — CBC
Hematocrit: 36.6 % — ABNORMAL LOW (ref 37.5–51.0)
Hemoglobin: 11.4 g/dL — ABNORMAL LOW (ref 13.0–17.7)
MCH: 24.9 pg — ABNORMAL LOW (ref 26.6–33.0)
MCHC: 31.1 g/dL — ABNORMAL LOW (ref 31.5–35.7)
MCV: 80 fL (ref 79–97)
Platelets: 119 10*3/uL — ABNORMAL LOW (ref 150–450)
RBC: 4.58 x10E6/uL (ref 4.14–5.80)
RDW: 13.2 % (ref 11.6–15.4)
WBC: 4.3 10*3/uL (ref 3.4–10.8)

## 2023-05-31 LAB — VITAMIN B12: Vitamin B-12: 366 pg/mL (ref 232–1245)

## 2023-06-13 DIAGNOSIS — K7460 Unspecified cirrhosis of liver: Secondary | ICD-10-CM | POA: Diagnosis not present

## 2023-06-15 ENCOUNTER — Other Ambulatory Visit: Payer: Self-pay | Admitting: Gastroenterology

## 2023-06-15 DIAGNOSIS — K7460 Unspecified cirrhosis of liver: Secondary | ICD-10-CM

## 2023-06-28 ENCOUNTER — Ambulatory Visit
Admission: RE | Admit: 2023-06-28 | Discharge: 2023-06-28 | Disposition: A | Discharge status: Home / Self Care | Payer: BC Managed Care – PPO | Source: Ambulatory Visit | Attending: Gastroenterology | Admitting: Gastroenterology

## 2023-06-28 DIAGNOSIS — K7460 Unspecified cirrhosis of liver: Secondary | ICD-10-CM | POA: Diagnosis not present

## 2023-07-20 DIAGNOSIS — M14671 Charcot's joint, right ankle and foot: Secondary | ICD-10-CM | POA: Diagnosis not present

## 2023-07-26 DIAGNOSIS — H40059 Ocular hypertension, unspecified eye: Secondary | ICD-10-CM | POA: Diagnosis not present

## 2023-07-26 DIAGNOSIS — E113313 Type 2 diabetes mellitus with moderate nonproliferative diabetic retinopathy with macular edema, bilateral: Secondary | ICD-10-CM | POA: Diagnosis not present

## 2023-08-03 ENCOUNTER — Other Ambulatory Visit: Payer: Self-pay | Admitting: Family Medicine

## 2023-08-03 ENCOUNTER — Other Ambulatory Visit: Payer: Self-pay | Admitting: Physician Assistant

## 2023-08-03 DIAGNOSIS — I252 Old myocardial infarction: Secondary | ICD-10-CM

## 2023-08-03 DIAGNOSIS — E1142 Type 2 diabetes mellitus with diabetic polyneuropathy: Secondary | ICD-10-CM

## 2023-08-03 NOTE — Telephone Encounter (Signed)
 Requested medication (s) are due for refill today: Yes  Requested medication (s) are on the active medication list: Yes  Last refill:  02/04/23  Future visit scheduled: Yes  Notes to clinic:  Unable to refill due to no refill protocol for this medication.      Requested Prescriptions  Pending Prescriptions Disp Refills   ENTRESTO 24-26 MG [Pharmacy Med Name: ENTRESTO 24-26 MG TAB] 180 tablet 1    Sig: TAKE 1 TABLET BY MOUTH TWICE DAILY     Off-Protocol Failed - 08/03/2023  5:50 PM      Failed - Medication not assigned to a protocol, review manually.      Passed - Valid encounter within last 12 months    Recent Outpatient Visits           2 months ago Annual physical exam   [place] [facility] Sherlyn Hay, DO   8 months ago Type 2 diabetes mellitus with diabetic polyneuropathy, without long-term current use of insulin (HCC)   [place] [facility] Edwards, Tiger, PA-C   1 year ago Type 2 diabetes mellitus with diabetic polyneuropathy, without long-term current use of insulin (HCC)   [place] [facility] Alfredia Ferguson, PA-C   1 year ago Type 2 diabetes mellitus with diabetic polyneuropathy, without long-term current use of insulin [facility])   [place] [facility] Alfredia Ferguson, PA-C   1 year ago Type 2 diabetes mellitus with diabetic polyneuropathy, without long-term current use of insulin [facility])   [place] [facility] Alfredia Ferguson, PA-C       Future Appointments             In 3 months Pardue, Monico Blitz, DO [place] [facility]

## 2023-09-18 NOTE — Progress Notes (Signed)
 Cardiology Office Note  Date:  09/19/2023   ID:  Yoshiaki Kreuser, DOB 1966/03/18, MRN [id number]  PCP:  Carlean Charter, DO   Chief Complaint  Patient presents with   12 month follow up     "Doing well."     HPI:  Cynthia Stainback is a 58 y.o. male with a hx of  CAD (prior PCI 2007, 2009),  MI s/p CABG x 5 2019,  hypertension,  hyperlipidemia,  diabetes  Iron def anemia Weight down from 400 pounds, stable 240 who presents for follow-up of his coronary artery disease   Last seen by Dr.Agbor Debra Familia in early April 2024  Last year,stumbled on a curb 5 months ago fracturing his right foot.  Spent time in a cast  compliant with medications including Coreg  Entresto  Jardiance  Lipitor Zetia   Active, denies chest pain on exertion concerning for angina Blood pressure low today, thinks it is dehydration, was busy all weekend helping parents move furniture   Prior notes Echo 09/2021 EF 45 to 50% History of fatigue with atenolol, carvedilol  Patient moved to the area from [place] .    MI in 2007 requiring stents.   2 years later he had additional stent placements.  In December 2019, due to persistent symptoms of chest discomfort, he underwent left heart cath showing multivessel disease requiring CABG.   left heart cath in 2011 and was told everything was okay.  Labs reviewed A1C 7.9, off victoza  EKG personally reviewed by myself on todays visit EKG Interpretation Date/Time:  Monday September 19 2023 10:04:17 EDT Ventricular Rate:  85 PR Interval:  178 QRS Duration:  102 QT Interval:  376 QTC Calculation: 447 R Axis:   39  Text Interpretation: Normal sinus rhythm Low voltage QRS Inferior infarct , age undetermined Cannot rule out Anterior infarct , age undetermined No previous ECGs available Confirmed by Belva Boyden [id number]) on 09/19/2023 10:23:00 AM   PMH:   has a past medical history of Anemia, Blood transfusion without reported diagnosis, Chronic gastritis w/ Metaplasia,  Diabetes mellitus without complication (HCC), Hypertension, Metaplasia of esophagus, and Myocardial infarction (HCC).  PSH:    Past Surgical History:  Procedure Laterality Date   CARDIAC CATHETERIZATION     CORONARY ARTERY BYPASS GRAFT     ESOPHAGOGASTRODUODENOSCOPY (EGD) WITH PROPOFOL  N/A 02/16/2022   Procedure: ESOPHAGOGASTRODUODENOSCOPY (EGD) WITH PROPOFOL ;  Surgeon: Marnee Sink, MD;  Location: [facility] END[facility];  Service: Endoscopy;  Laterality: N/A;   ESOPHAGOGASTRODUODENOSCOPY (EGD) WITH PROPOFOL  N/A 09/20/2022   Procedure: ESOPHAGOGASTRODUODENOSCOPY (EGD) WITH PROPOFOL ;  Surgeon: Shane Darling, MD;  Location: [facility] ENDOSCOPY;  Service: Endoscopy;  Laterality: N/A;   thumb surgery      Current Outpatient Medications  Medication Sig Dispense Refill   aspirin EC 81 MG tablet Take 81 mg by mouth daily. Swallow whole.     atorvastatin  (LIPITOR) 80 MG tablet Take 1 tablet (80 mg total) by mouth daily. 90 tablet 2   carvedilol  (COREG ) 3.125 MG tablet Take 3.125 mg by mouth 2 (two) times daily with a meal.     Continuous Blood Gluc Sensor (FREESTYLE LIBRE 2 SENSOR) MISC Use to check blood sugar continuously replace every 14 days 2 each 3   ENTRESTO  24-26 MG TAKE 1 TABLET BY MOUTH TWICE DAILY 180 tablet 1   ezetimibe  (ZETIA ) 10 MG tablet Take 1 tablet (10 mg total) by mouth daily. 90 tablet 2   JARDIANCE  25 MG TABS tablet TAKE 1 TABLET BY MOUTH ONCE DAILY 90 tablet 3  timolol (TIMOPTIC) 0.5 % ophthalmic solution SMARTSIG:In Eye(s)     metFORMIN  (GLUCOPHAGE ) 1000 MG tablet TAKE 1 TABLET BY MOUTH TWICE DAILY (Patient not taking: Reported on 09/19/2023) 180 tablet 1   No current facility-administered medications for this visit.    Allergies:   Other   Social History:  The patient  reports that he has never smoked. He quit smokeless tobacco use about 4 years ago.  His smokeless tobacco use included chew. He reports that he does not currently use alcohol. He reports that he does not use  drugs.   Family History:   family history includes Bipolar disorder in his sister; COPD in his maternal uncle; Cancer in his maternal grandmother; Heart Problems in his father, mother, paternal grandfather, paternal grandmother, and paternal uncle.   Review of Systems: Review of Systems  Constitutional: Negative.   HENT: Negative.    Respiratory: Negative.    Cardiovascular: Negative.   Gastrointestinal: Negative.   Musculoskeletal: Negative.   Neurological: Negative.   Psychiatric/Behavioral: Negative.    All other systems reviewed and are negative.  PHYSICAL EXAM: VS:  BP 100/68 (BP Location: Left Arm, Patient Position: Sitting, Cuff Size: Normal)   Pulse 85   Ht 6\' 4"  (1.93 m)   Wt 245 lb 2 oz (111.2 kg)   SpO2 98%   BMI 29.84 kg/m  , BMI Body mass index is 29.84 kg/m. GEN: Well nourished, well developed, in no acute distress HEENT: normal Neck: no JVD, carotid bruits, or masses Cardiac: RRR; no murmurs, rubs, or gallops,no edema  Respiratory:  clear to auscultation bilaterally, normal work of breathing GI: soft, nontender, nondistended, + BS MS: no deformity or atrophy Skin: warm and dry, no rash Neuro:  Strength and sensation are intact Psych: euthymic mood, full affect  Recent Labs: 05/30/2023: ALT 25; BUN 16; Creatinine, Ser 1.08; Hemoglobin 11.4; Platelets 119; Potassium 4.2; Sodium 143   Lipid Panel Lab Results  Component Value Date   CHOL 244 (H) 05/30/2023   HDL 51 05/30/2023   LDLCALC 171 (H) 05/30/2023   TRIG 121 05/30/2023    Wt Readings from Last 3 Encounters:  09/19/23 245 lb 2 oz (111.2 kg)  05/23/23 250 lb (113.4 kg)  11/11/22 230 lb (104.3 kg)     ASSESSMENT AND PLAN:  Problem List Items Addressed This Visit       Cardiology Problems   Hyperlipidemia associated with type 2 diabetes mellitus (HCC)   Hypertension associated with diabetes (HCC)   Relevant Orders   EKG 12-Lead (Completed)     Other   Type 2 diabetes mellitus with  diabetic polyneuropathy, without long-term current use of insulin (HCC)   Other Visit Diagnoses       Coronary artery disease of native artery of native heart with stable angina pectoris (HCC)    -  Primary   Relevant Orders   EKG 12-Lead (Completed)     Hx of CABG       Relevant Orders   EKG 12-Lead (Completed)     Primary hypertension       Relevant Orders   EKG 12-Lead (Completed)     Pure hypercholesterolemia          CAd with stable angina Hx of MI, stents and CABG 2019 Currently with no symptoms of angina. No further workup at this time. Continue current medication regimen.  Essential hypertension Blood pressure low, recommend hydration and closely monitor blood pressure For orthostasis symptoms may need to cut back on his  medications  Cardiomyopathy Ejection fraction 45 to 50% in February 2024 On Entresto , Coreg , Jardiance  Normal BMP Appears euvolemic No changes to medications  Hyperlipidemia Was off Lipitor for elevated LFTs, back on Lipitor and Zetia  Previously numbers well-controlled   Signed, Juanda Noon, M.D., Ph.D. [facility] [place], [place] [phone]

## 2023-09-19 ENCOUNTER — Ambulatory Visit: Attending: Cardiovascular Disease | Admitting: Cardiovascular Disease

## 2023-09-19 ENCOUNTER — Encounter: Payer: Self-pay | Admitting: Cardiovascular Disease

## 2023-09-19 VITALS — BP 100/68 | HR 85 | Ht 76.0 in | Wt 245.1 lb

## 2023-09-19 DIAGNOSIS — I152 Hypertension secondary to endocrine disorders: Secondary | ICD-10-CM

## 2023-09-19 DIAGNOSIS — E7800 Pure hypercholesterolemia, unspecified: Secondary | ICD-10-CM

## 2023-09-19 DIAGNOSIS — Z951 Presence of aortocoronary bypass graft: Secondary | ICD-10-CM

## 2023-09-19 DIAGNOSIS — E785 Hyperlipidemia, unspecified: Secondary | ICD-10-CM

## 2023-09-19 DIAGNOSIS — I25118 Atherosclerotic heart disease of native coronary artery with other forms of angina pectoris: Secondary | ICD-10-CM

## 2023-09-19 DIAGNOSIS — E1169 Type 2 diabetes mellitus with other specified complication: Secondary | ICD-10-CM

## 2023-09-19 DIAGNOSIS — E1159 Type 2 diabetes mellitus with other circulatory complications: Secondary | ICD-10-CM

## 2023-09-19 DIAGNOSIS — I252 Old myocardial infarction: Secondary | ICD-10-CM

## 2023-09-19 DIAGNOSIS — I10 Essential (primary) hypertension: Secondary | ICD-10-CM

## 2023-09-19 DIAGNOSIS — E1142 Type 2 diabetes mellitus with diabetic polyneuropathy: Secondary | ICD-10-CM

## 2023-09-19 MED ORDER — ATORVASTATIN CALCIUM 80 MG PO TABS: 80.0000 mg | ORAL_TABLET | Freq: Every day | ORAL | 3 refills | Status: AC

## 2023-09-19 MED ORDER — ENTRESTO 24-26 MG PO TABS: 1.0000 | ORAL_TABLET | Freq: Two times a day (BID) | ORAL | 3 refills | Status: AC

## 2023-09-19 MED ORDER — EZETIMIBE 10 MG PO TABS: 10.0000 mg | ORAL_TABLET | Freq: Every day | ORAL | 3 refills | Status: AC

## 2023-09-19 MED ORDER — NITROGLYCERIN 0.4 MG SL SUBL: 0.4000 mg | SUBLINGUAL_TABLET | SUBLINGUAL | 3 refills | Status: AC | PRN

## 2023-09-19 MED ORDER — CARVEDILOL 3.125 MG PO TABS
3.1250 mg | ORAL_TABLET | Freq: Two times a day (BID) | ORAL | 3 refills | Status: AC
Start: 2023-09-19 — End: ?

## 2023-09-19 NOTE — Addendum Note (Signed)
 Addended by: Elvia Hammans on: 09/19/2023 10:44 AM   Modules accepted: Orders

## 2023-09-19 NOTE — Patient Instructions (Signed)

## 2023-10-21 IMAGING — CR DG HAND COMPLETE 3+V*L*
1 series · 3 of 3 positions shown · non-contrast
Comparison: None.

CLINICAL DATA: Left hand thumb pain, injury in [REDACTED] when board
fell on hand.

EXAM:
LEFT HAND - COMPLETE 3+ VIEW

[Series 1: dg hand complete left · 0.14mm/px · 3 of 3 slices shown]
[im 1/3]
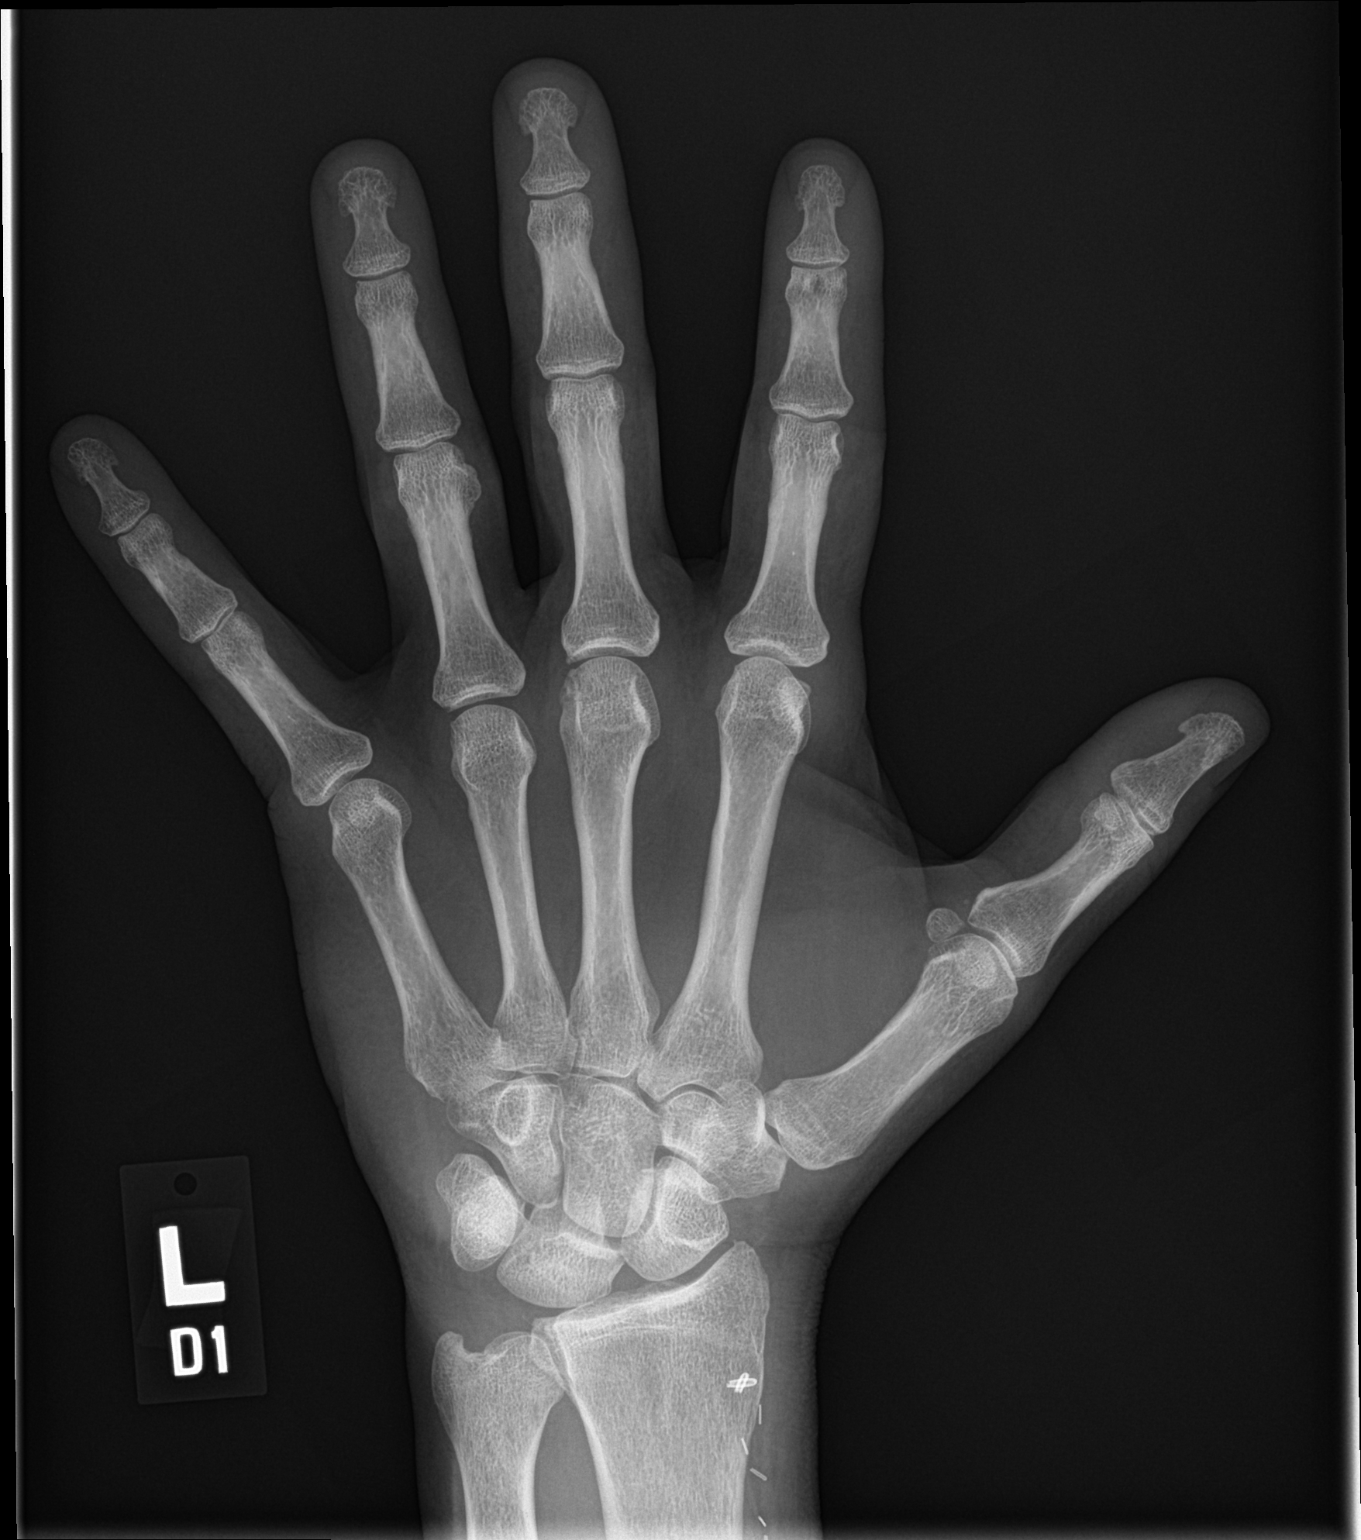
[im 2/3]
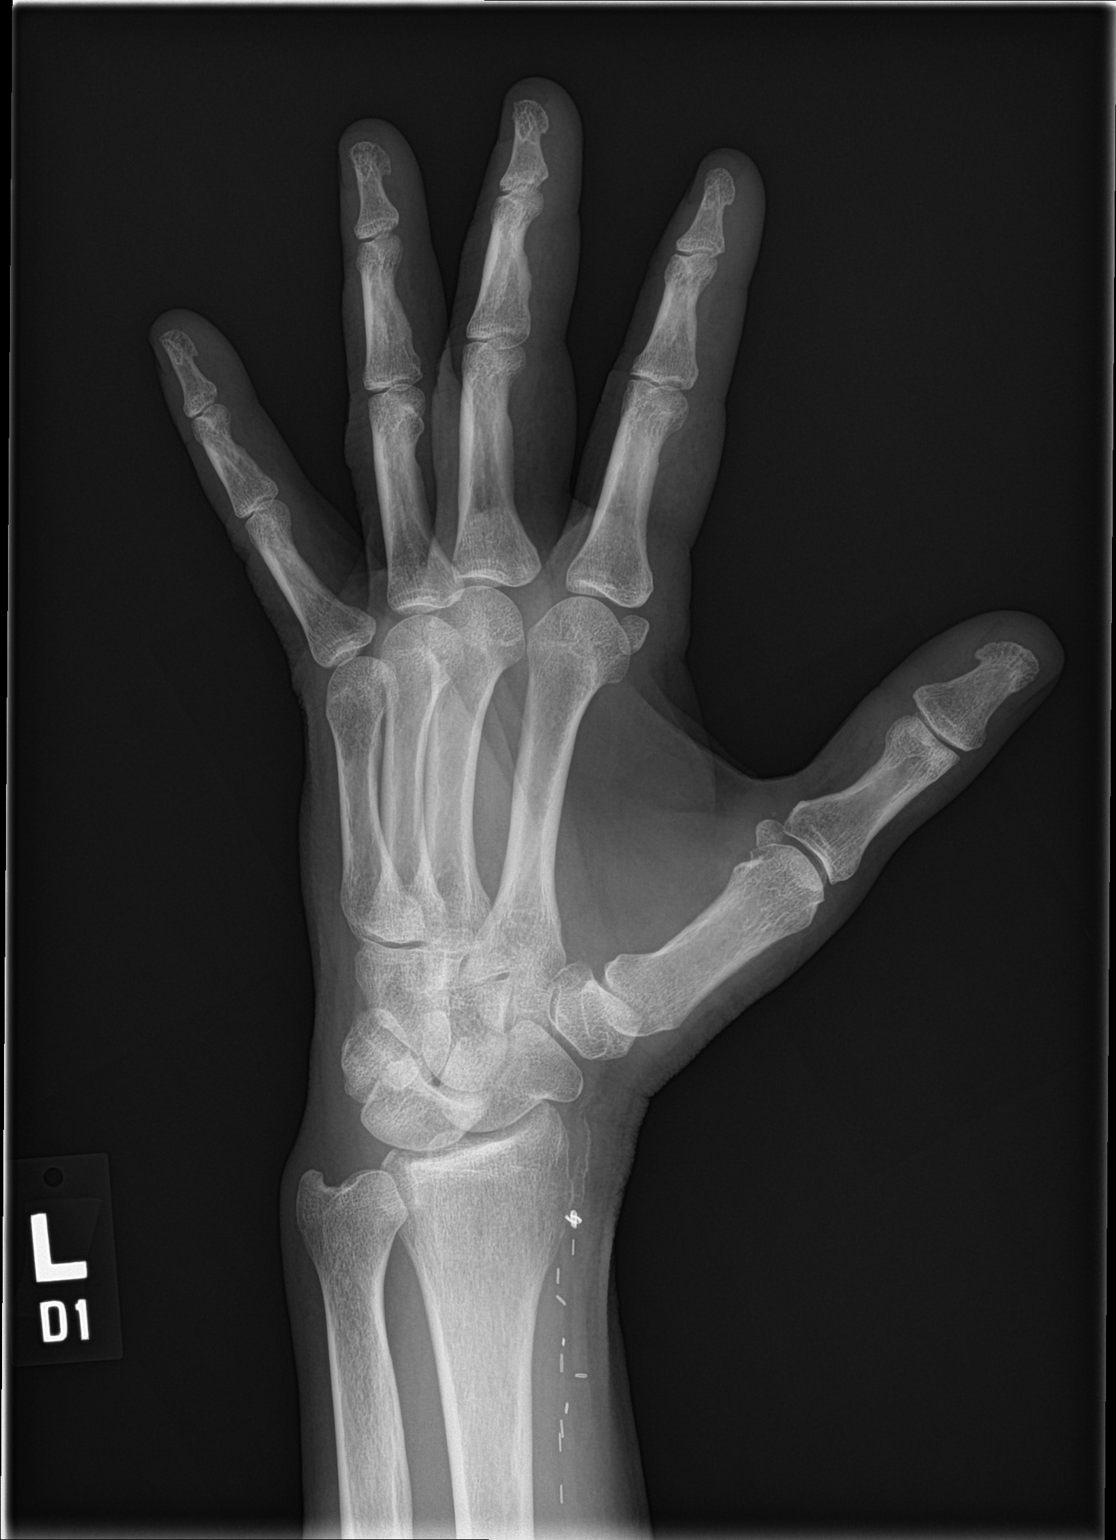
[im 3/3]
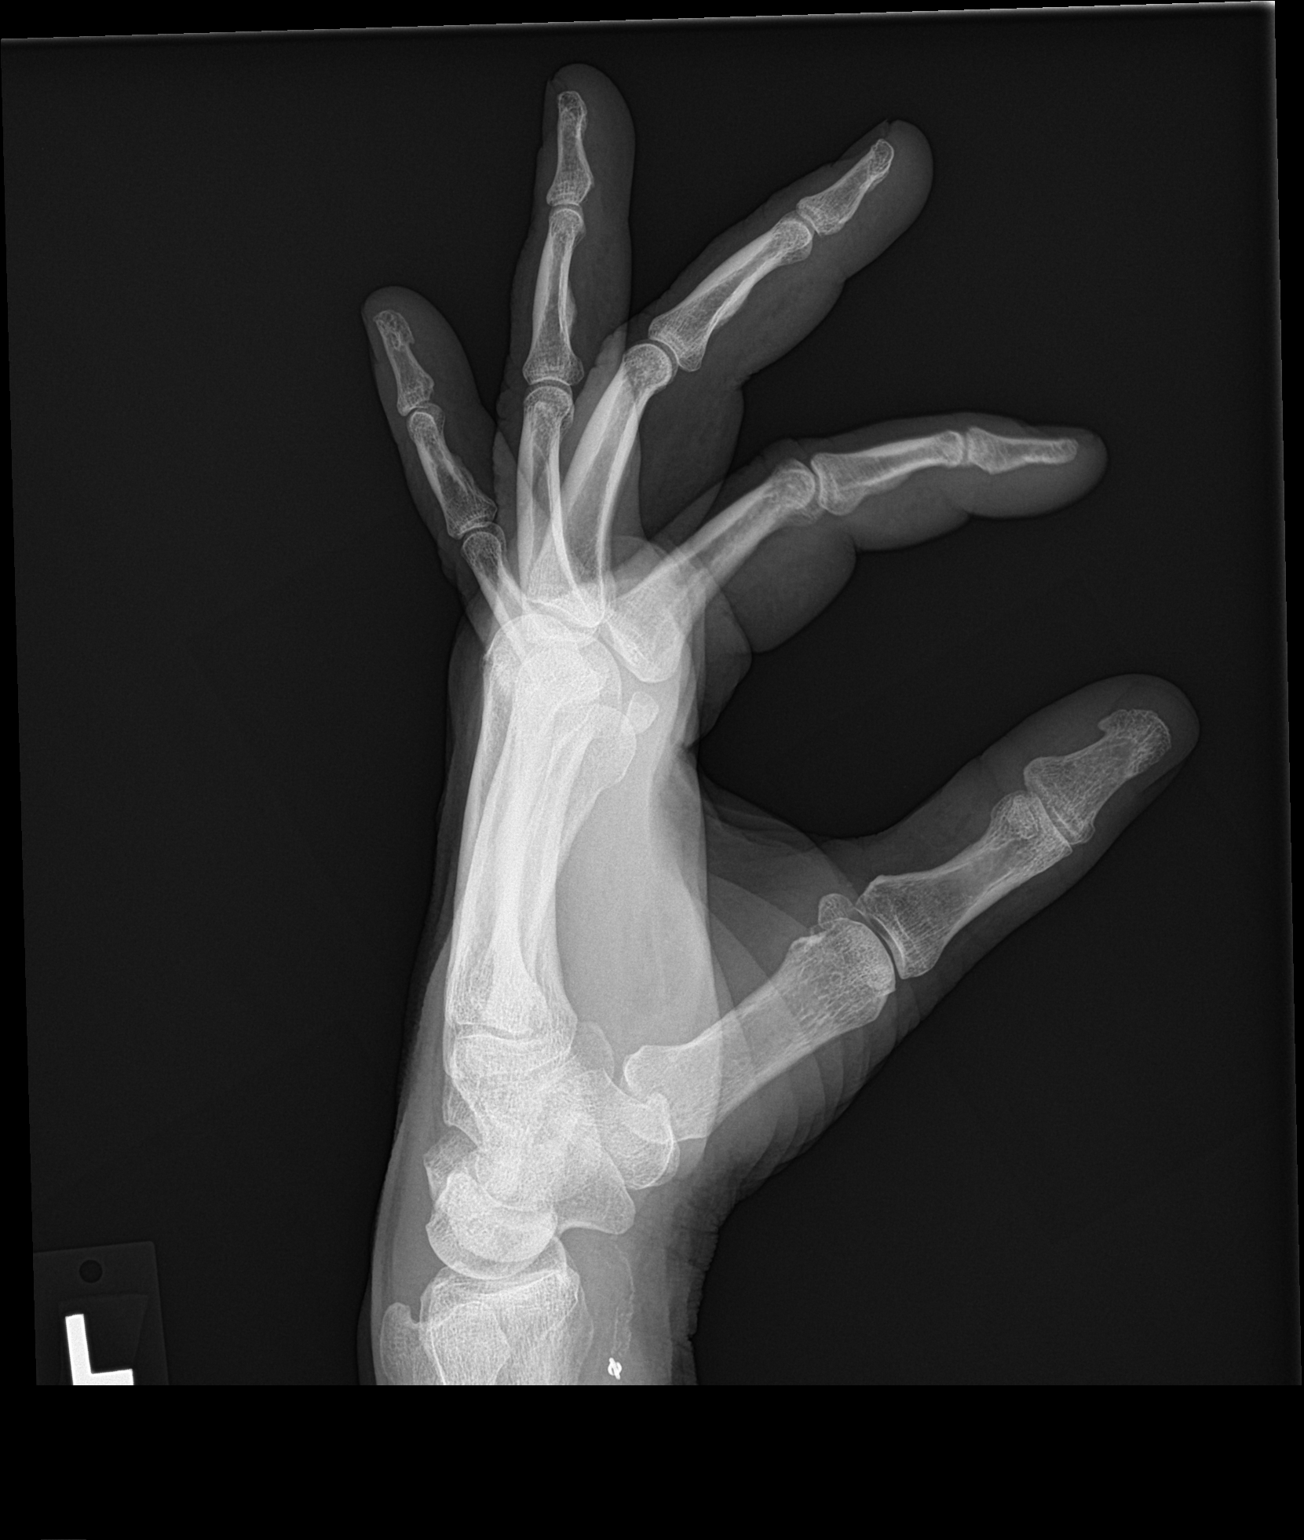

[3 of 3 positions shown; findings below may reference images not displayed]

FINDINGS: There is no evidence of fracture or dislocation. Cortical
irregularity along the third metacarpal head likely degenerative
changes versus old healed fracture. Soft tissues are unremarkable.
Surgical clips overlie the distal forearm. Vascular calcifications.
IMPRESSION: Negative.

## 2023-11-04 DIAGNOSIS — H40053 Ocular hypertension, bilateral: Secondary | ICD-10-CM | POA: Diagnosis not present

## 2023-11-14 DIAGNOSIS — H40059 Ocular hypertension, unspecified eye: Secondary | ICD-10-CM | POA: Diagnosis not present

## 2023-11-14 DIAGNOSIS — E113313 Type 2 diabetes mellitus with moderate nonproliferative diabetic retinopathy with macular edema, bilateral: Secondary | ICD-10-CM | POA: Diagnosis not present

## 2023-11-21 ENCOUNTER — Ambulatory Visit (INDEPENDENT_AMBULATORY_CARE_PROVIDER_SITE_OTHER): Payer: Self-pay | Admitting: Family Medicine

## 2023-11-21 ENCOUNTER — Encounter: Payer: Self-pay | Admitting: Family Medicine

## 2023-11-21 VITALS — BP 124/82 | HR 84 | Resp 16 | Wt 244.5 lb

## 2023-11-21 DIAGNOSIS — E1169 Type 2 diabetes mellitus with other specified complication: Secondary | ICD-10-CM | POA: Diagnosis not present

## 2023-11-21 DIAGNOSIS — E1159 Type 2 diabetes mellitus with other circulatory complications: Secondary | ICD-10-CM | POA: Diagnosis not present

## 2023-11-21 DIAGNOSIS — D696 Thrombocytopenia, unspecified: Secondary | ICD-10-CM

## 2023-11-21 DIAGNOSIS — Z7984 Long term (current) use of oral hypoglycemic drugs: Secondary | ICD-10-CM

## 2023-11-21 DIAGNOSIS — I152 Hypertension secondary to endocrine disorders: Secondary | ICD-10-CM

## 2023-11-21 DIAGNOSIS — E1142 Type 2 diabetes mellitus with diabetic polyneuropathy: Secondary | ICD-10-CM

## 2023-11-21 DIAGNOSIS — I252 Old myocardial infarction: Secondary | ICD-10-CM | POA: Diagnosis not present

## 2023-11-21 DIAGNOSIS — M14679 Charcot's joint, unspecified ankle and foot: Secondary | ICD-10-CM

## 2023-11-21 DIAGNOSIS — E785 Hyperlipidemia, unspecified: Secondary | ICD-10-CM

## 2023-11-21 DIAGNOSIS — Z79899 Other long term (current) drug therapy: Secondary | ICD-10-CM

## 2023-11-21 MED ORDER — TIRZEPATIDE 2.5 MG/0.5ML ~~LOC~~ SOAJ: 2.5000 mg | SUBCUTANEOUS | 0 refills | Status: DC

## 2023-11-21 NOTE — Progress Notes (Signed)
 Established patient visit   Patient: Phillip Robles   DOB: 05-22-1966   58 y.o. Male  MRN: [id number] Visit Date: 11/21/2023  Today's healthcare provider: LAURAINE LOISE BUOY, DO   Chief Complaint  Patient presents with   Diabetes   Hypertension   Subjective    Diabetes  Hypertension   Last annual 05/23/2023  Phillip Robles is a 58 year old male with diabetes and coronary artery disease who presents for a follow-up visit.  He feels better overall since his last visit. He has a bulge on the outside of his foot and is managing it by being careful with impacts and adjusting his footwear for comfort.  He has switched cardiologists and has had no issues with chest pain since his April cardiology visit. He is not experiencing any problems obtaining his medications. He has a history of multiple myocardial infarctions, with a minimum of four (he is unsure of the exact number). He was off his cholesterol medications for a period but has resumed them since April. He is currently on ezetimibe  and atorvastatin .  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 He reports improved energy levels and digestion after stopping metformin , as recommended by his gastroenterologist. He estimates his blood sugar levels to be between 7 and 7.5. He has stopped taking metformin  and is currently only on Jardiance . He has previously tried Victoza and several weekly injectable medications (including Trulicity and one other he does not recall the name of) but experienced significant nausea and vomiting, leading to discontinuation of all three. He is open to trying new medications to manage his diabetes.  He follows an exercise plan focusing on cycling, as recommended by his podiatrist, and using a rowing machine. He rides about fifty miles a week and reports no chest pain or issues during exercise. He has a history of extensive cycling  experience.       Medications: Outpatient Medications Prior to Visit  Medication Sig Note   aspirin EC 81 MG tablet Take 81 mg by mouth daily. Swallow whole.    atorvastatin  (LIPITOR) 80 MG tablet Take 1 tablet (80 mg total) by mouth daily.    carvedilol  (COREG ) 3.125 MG tablet Take 1 tablet (3.125 mg total) by mouth 2 (two) times daily with a meal.    Continuous Blood Gluc Sensor (FREESTYLE LIBRE 2 SENSOR) MISC Use to check blood sugar continuously replace every 14 days    ezetimibe  (ZETIA ) 10 MG tablet Take 1 tablet (10 mg total) by mouth daily.    JARDIANCE  25 MG TABS tablet TAKE 1 TABLET BY MOUTH ONCE DAILY    nitroGLYCERIN  (NITROSTAT ) 0.4 MG SL tablet Place 1 tablet (0.4 mg total) under the tongue every 5 (five) minutes as needed for chest pain.    sacubitril-valsartan (ENTRESTO ) 24-26 MG Take 1 tablet by mouth 2 (two) times daily.    timolol (TIMOPTIC) 0.5 % ophthalmic solution SMARTSIG:In Eye(s)    [DISCONTINUED] metFORMIN  (GLUCOPHAGE ) 1000 MG tablet TAKE 1 TABLET BY MOUTH TWICE DAILY 11/21/2023: advised by gastro d/t GI upset   No facility-administered medications prior to visit.        Objective    BP 124/82 (BP Location: Right Arm, Patient Position: Sitting, Cuff Size: Large)   Pulse 84   Resp 16   Wt 244 lb 8 oz (110.9 kg)   SpO2 100%   BMI 29.76 kg/m     Physical Exam Vitals and nursing note reviewed.  Constitutional:      General: He is not in acute distress.    Appearance: Normal appearance.  HENT:     Head: Normocephalic and atraumatic.   Eyes:     General: No scleral icterus.    Conjunctiva/sclera: Conjunctivae normal.    Cardiovascular:     Rate and Rhythm: Normal rate.  Pulmonary:     Effort: Pulmonary effort is normal.   Neurological:     Mental Status: He is alert and oriented to person, place, and time. Mental status is at baseline.   Psychiatric:        Mood and Affect: Mood normal.        Behavior: Behavior normal.      No results  found for any visits on 11/21/23.  Assessment & Plan    Type 2 diabetes mellitus with diabetic polyneuropathy, without long-term current use of insulin (HCC) -     Comprehensive metabolic panel with GFR -     Hemoglobin A1c -     Vitamin B12 -     Tirzepatide; Inject 2.5 mg into the skin once a week.  Dispense: 2 mL; Refill: 0  Hyperlipidemia associated with type 2 diabetes mellitus (HCC) -     Lipid panel  Hypertension associated with diabetes (HCC)  History of MI (myocardial infarction)  Thrombocytopenia (HCC) -     CBC  High risk medication use -     Vitamin B12  Charcot arthropathy of midfoot       Diabetes Mellitus Type 2 Type 2 Diabetes managed with Jardiance . Discontinued metformin  due to side effects. Estimated A1c 7-7.5. Previous intolerance to Victoza and Trulicity. Preference for Mounjaro over Ozempic. Open to new medications, insurance coverage critical. - Order A1c test. - Prescribe Mounjaro, monitor tolerance and effectiveness. - Consider Januvia if Mounjaro not tolerated. - Discuss insurance coverage for Mounjaro and alternatives.  Hypertension; history of MI Chronic, stable.  Denies chest pain and shortness of breath.  Following with Dr. Gollan, cardiology.  Compliant with medications including Coreg , Entresto , Jardiance , Lipitor and Zetia  (though he restarted his cholesterol medications after his visit in April).  Will recheck his levels today.  Hyperlipidemia Managed with atorvastatin  and ezetimibe . Cardiologist noted elevated cholesterol. - Continue atorvastatin  and ezetimibe . - Recheck cholesterol levels.  Charcot arthropathy of midfoot Foot pain with lateral bulge. Managed with impact precautions and footwear adjustments. Following podiatrist's (Dr. Kit) exercise plan. - Continue exercise regimen as advised by podiatrist. - Adjust footwear as needed.      Return in about 3 months (around 02/21/2024) for DM.      I discussed the assessment  and treatment plan with the patient  The patient was provided an opportunity to ask questions and all were answered. The patient agreed with the plan and demonstrated an understanding of the instructions.   The patient was advised to call back or seek an in-person evaluation if the symptoms worsen or if the condition fails to improve as anticipated.    LAURAINE LOISE BUOY, DO  [facility] [phone] (phone) [phone] (fax)  [facility]

## 2023-12-06 DIAGNOSIS — E1142 Type 2 diabetes mellitus with diabetic polyneuropathy: Secondary | ICD-10-CM | POA: Diagnosis not present

## 2023-12-06 DIAGNOSIS — Z79899 Other long term (current) drug therapy: Secondary | ICD-10-CM | POA: Diagnosis not present

## 2023-12-06 DIAGNOSIS — D696 Thrombocytopenia, unspecified: Secondary | ICD-10-CM | POA: Diagnosis not present

## 2023-12-06 DIAGNOSIS — E785 Hyperlipidemia, unspecified: Secondary | ICD-10-CM | POA: Diagnosis not present

## 2023-12-06 DIAGNOSIS — E1169 Type 2 diabetes mellitus with other specified complication: Secondary | ICD-10-CM | POA: Diagnosis not present

## 2023-12-07 LAB — CBC
Hematocrit: 46.7 % (ref 37.5–51.0)
Hemoglobin: 16 g/dL (ref 13.0–17.7)
MCH: 33 pg (ref 26.6–33.0)
MCHC: 34.3 g/dL (ref 31.5–35.7)
MCV: 96 fL (ref 79–97)
Platelets: 109 x10E3/uL — ABNORMAL LOW (ref 150–450)
RBC: 4.85 x10E6/uL (ref 4.14–5.80)
RDW: 12.5 % (ref 11.6–15.4)
WBC: 5.3 x10E3/uL (ref 3.4–10.8)

## 2023-12-07 LAB — HEMOGLOBIN A1C
Est. average glucose Bld gHb Est-mCnc: 177 mg/dL
Hgb A1c MFr Bld: 7.8 % — ABNORMAL HIGH (ref 4.8–5.6)

## 2023-12-07 LAB — COMPREHENSIVE METABOLIC PANEL WITH GFR
ALT: 46 IU/L — ABNORMAL HIGH (ref 0–44)
AST: 33 IU/L (ref 0–40)
Albumin: 4.5 g/dL (ref 3.8–4.9)
Alkaline Phosphatase: 149 IU/L — ABNORMAL HIGH (ref 44–121)
BUN/Creatinine Ratio: 16 (ref 9–20)
BUN: 18 mg/dL (ref 6–24)
Bilirubin Total: 1.5 mg/dL — ABNORMAL HIGH (ref 0.0–1.2)
CO2: 19 mmol/L — ABNORMAL LOW (ref 20–29)
Calcium: 10.2 mg/dL (ref 8.7–10.2)
Chloride: 104 mmol/L (ref 96–106)
Creatinine, Ser: 1.12 mg/dL (ref 0.76–1.27)
Globulin, Total: 2 g/dL (ref 1.5–4.5)
Glucose: 105 mg/dL — ABNORMAL HIGH (ref 70–99)
Potassium: 4.8 mmol/L (ref 3.5–5.2)
Sodium: 143 mmol/L (ref 134–144)
Total Protein: 6.5 g/dL (ref 6.0–8.5)
eGFR: 76 mL/min/1.73 (ref >59)

## 2023-12-07 LAB — LIPID PANEL
Chol/HDL Ratio: 2.5 ratio (ref 0.0–5.0)
Cholesterol, Total: 106 mg/dL (ref 100–199)
HDL: 43 mg/dL (ref >39)
LDL Chol Calc (NIH): 43 mg/dL (ref 0–99)
Triglycerides: 110 mg/dL (ref 0–149)
VLDL Cholesterol Cal: 20 mg/dL (ref 5–40)

## 2023-12-07 LAB — VITAMIN B12: Vitamin B-12: >2000 pg/mL — ABNORMAL HIGH (ref 232–1245)

## 2023-12-12 DIAGNOSIS — K7460 Unspecified cirrhosis of liver: Secondary | ICD-10-CM | POA: Diagnosis not present

## 2023-12-14 ENCOUNTER — Other Ambulatory Visit: Payer: Self-pay | Admitting: Gastroenterology

## 2023-12-14 DIAGNOSIS — K7460 Unspecified cirrhosis of liver: Secondary | ICD-10-CM

## 2023-12-15 ENCOUNTER — Ambulatory Visit
Admission: RE | Admit: 2023-12-15 | Discharge: 2023-12-15 | Disposition: A | Discharge status: Home / Self Care | Source: Ambulatory Visit | Attending: Gastroenterology | Admitting: Gastroenterology

## 2023-12-15 ENCOUNTER — Ambulatory Visit: Payer: Self-pay | Admitting: Family Medicine

## 2023-12-15 DIAGNOSIS — R161 Splenomegaly, not elsewhere classified: Secondary | ICD-10-CM | POA: Diagnosis not present

## 2023-12-15 DIAGNOSIS — K7460 Unspecified cirrhosis of liver: Secondary | ICD-10-CM | POA: Insufficient documentation

## 2024-02-15 ENCOUNTER — Encounter: Payer: Self-pay | Admitting: Family Medicine

## 2024-02-18 ENCOUNTER — Other Ambulatory Visit: Payer: Self-pay | Admitting: Family Medicine

## 2024-02-18 DIAGNOSIS — E1142 Type 2 diabetes mellitus with diabetic polyneuropathy: Secondary | ICD-10-CM

## 2024-02-18 MED ORDER — TIRZEPATIDE 2.5 MG/0.5ML ~~LOC~~ SOAJ: 2.5000 mg | SUBCUTANEOUS | 0 refills | Status: DC

## 2024-02-20 ENCOUNTER — Telehealth: Payer: Self-pay | Admitting: Family Medicine

## 2024-02-20 ENCOUNTER — Other Ambulatory Visit (HOSPITAL_COMMUNITY): Payer: Self-pay

## 2024-02-20 NOTE — Telephone Encounter (Signed)
 We received fax from [facility] [address], [place], [place] [address] Phone: [phone]  Mounjaro  2.5MG /0.5ML Auto-injectors has been rejected and requires prior authorization Key: [id number]

## 2024-02-21 ENCOUNTER — Other Ambulatory Visit (HOSPITAL_COMMUNITY): Payer: Self-pay

## 2024-02-21 ENCOUNTER — Ambulatory Visit: Admitting: Family Medicine

## 2024-02-21 ENCOUNTER — Telehealth: Payer: Self-pay

## 2024-02-21 NOTE — Telephone Encounter (Signed)
 Pharmacy Patient Advocate Encounter   Received notification from Physician's Office that prior authorization for Mounjaro  2.5MG /0.5ML auto-injectors  is required/requested.   Insurance verification completed.   The patient is insured through [employer] .   Per test claim: PA required; PA submitted to above mentioned insurance via Latent Key/confirmation #/EOC [id number] Status is pending

## 2024-02-21 NOTE — Telephone Encounter (Signed)
 PA request has been Submitted. New Encounter has been or will be created for follow up. For additional info see Pharmacy Prior Auth telephone encounter from 02/21/2024.

## 2024-02-23 NOTE — Telephone Encounter (Signed)
 Pharmacy Patient Advocate Encounter  Received notification from  [employer]  that Prior Authorization for  Mounjaro  2.5MG /0.5ML auto-injectors  has been APPROVED from 02/21/2024 to 02/20/2025   PA #/Case ID/Reference #: [id number]

## 2024-04-03 ENCOUNTER — Encounter: Payer: Self-pay | Admitting: Family Medicine

## 2024-04-03 ENCOUNTER — Ambulatory Visit (INDEPENDENT_AMBULATORY_CARE_PROVIDER_SITE_OTHER): Admitting: Family Medicine

## 2024-04-03 VITALS — BP 112/79 | HR 92 | Temp 97.8°F | Ht 76.0 in | Wt 234.7 lb

## 2024-04-03 DIAGNOSIS — Z7985 Long-term (current) use of injectable non-insulin antidiabetic drugs: Secondary | ICD-10-CM

## 2024-04-03 DIAGNOSIS — Z789 Other specified health status: Secondary | ICD-10-CM

## 2024-04-03 DIAGNOSIS — E785 Hyperlipidemia, unspecified: Secondary | ICD-10-CM

## 2024-04-03 DIAGNOSIS — E1159 Type 2 diabetes mellitus with other circulatory complications: Secondary | ICD-10-CM

## 2024-04-03 DIAGNOSIS — R7989 Other specified abnormal findings of blood chemistry: Secondary | ICD-10-CM

## 2024-04-03 DIAGNOSIS — E1169 Type 2 diabetes mellitus with other specified complication: Secondary | ICD-10-CM | POA: Diagnosis not present

## 2024-04-03 DIAGNOSIS — R5383 Other fatigue: Secondary | ICD-10-CM | POA: Diagnosis not present

## 2024-04-03 DIAGNOSIS — D696 Thrombocytopenia, unspecified: Secondary | ICD-10-CM | POA: Diagnosis not present

## 2024-04-03 DIAGNOSIS — E1142 Type 2 diabetes mellitus with diabetic polyneuropathy: Secondary | ICD-10-CM

## 2024-04-03 DIAGNOSIS — K5909 Other constipation: Secondary | ICD-10-CM | POA: Diagnosis not present

## 2024-04-03 DIAGNOSIS — R748 Abnormal levels of other serum enzymes: Secondary | ICD-10-CM | POA: Diagnosis not present

## 2024-04-03 DIAGNOSIS — I152 Hypertension secondary to endocrine disorders: Secondary | ICD-10-CM

## 2024-04-03 MED ORDER — SITAGLIPTIN PHOSPHATE 100 MG PO TABS: 100.0000 mg | ORAL_TABLET | Freq: Every day | ORAL | 3 refills | Status: DC

## 2024-04-03 NOTE — Assessment & Plan Note (Addendum)
 Chronic, uncontrolled.  Blood sugars previously well-managed with exercise and Mounjaro  but he is unable to tolerate Mounjaro  long-term.  Did not tolerate Victoza.  Hesitant to try Ozempic due to past GLP-1 agonist intolerance. Considering Januvia for better tolerance. Discussed potential switch to insulin if Januvia is not tolerated.  - Prescribed Januvia. - Ordered metabolic panel, A1c, and urine analysis. - Discussed continuous glucose monitoring options. - Scheduled follow-up for mid-December to assess Januvia response.

## 2024-04-03 NOTE — Patient Instructions (Signed)
 If you would like to try a continuous glucose meter, OTC options include Dexcom Stelo and the Freestyle Rio (both for about or under $99/month).

## 2024-04-03 NOTE — Progress Notes (Signed)
 Established patient visit   Patient: Phillip Robles   DOB: Nov 02, 1965   58 y.o. Male  MRN: [id number] Visit Date: 04/03/2024  Today's healthcare provider: LAURAINE LOISE BUOY, DO   Chief Complaint  Patient presents with   Diabetes    Phillip Robles is a 58 y.o. male who presents for follow up of diabetes.  Patient reports that he has had a rough month with Mounjaro  having fatigue, digestion, muscle aches, and lack of motivation.  States no nausea or vomiting.  Vaccines all declined.   Subjective    Diabetes   Phillip Robles is a 58 year old male with diabetes who presents with fatigue, muscle aches, and digestive issues after starting Mounjaro .  He has been experiencing significant fatigue, muscle aches, and digestive issues, including constipation, since starting Mounjaro  one month ago (first dose on 03/02/2024). His motivation for daily activities has decreased significantly, describing it as 'like somebody just pulled a rug out from under me.' He has been using Miralax extensively to manage constipation, which has been a recurring issue over the past six years, particularly exacerbated by metformin  use. He stopped metformin  in late January or early February, which initially improved his digestion and energy levels.  He has a history of diabetes and has previously been on Victoza, which caused significant nausea and vomiting, leading to discontinuation. He has not tried Ozempic. His blood sugars have remained stable, similar to when he was engaging in significant physical activity from June to September. He has not been on insulin recently but has used Lantus in the past when he was heavier.  He has a history of Charcot arthropathy and remains cautious about impact activities. He also recalls a past incident where he had a toe amputated due to an ingrown toenail that led to bone infection.  He has a history of heart issues and is vigilant about symptoms, often questioning if they are  related to his heart. He has used a continuous glucose monitor in the past, which he found informative, but stopped using it when he lost weight and had difficulty getting readings from his arm. No nausea or vomiting, but reports fatigue, muscle aches, and constipation.      Medications: Outpatient Medications Prior to Visit  Medication Sig   aspirin EC 81 MG tablet Take 81 mg by mouth daily. Swallow whole.   atorvastatin  (LIPITOR) 80 MG tablet Take 1 tablet (80 mg total) by mouth daily.   carvedilol  (COREG ) 3.125 MG tablet Take 1 tablet (3.125 mg total) by mouth 2 (two) times daily with a meal.   ezetimibe  (ZETIA ) 10 MG tablet Take 1 tablet (10 mg total) by mouth daily.   JARDIANCE  25 MG TABS tablet TAKE 1 TABLET BY MOUTH ONCE DAILY   nitroGLYCERIN  (NITROSTAT ) 0.4 MG SL tablet Place 1 tablet (0.4 mg total) under the tongue every 5 (five) minutes as needed for chest pain.   sacubitril-valsartan (ENTRESTO ) 24-26 MG Take 1 tablet by mouth 2 (two) times daily.   timolol (TIMOPTIC) 0.5 % ophthalmic solution SMARTSIG:In Eye(s)   [DISCONTINUED] Continuous Blood Gluc Sensor (FREESTYLE LIBRE 2 SENSOR) MISC Use to check blood sugar continuously replace every 14 days   [DISCONTINUED] tirzepatide  (MOUNJARO ) 2.5 MG/0.5ML Pen Inject 2.5 mg into the skin once a week.   No facility-administered medications prior to visit.        Objective    BP 112/79 (BP Location: Left Arm, Patient Position: Sitting)   Pulse 92   Temp 97.8  F (36.6 C) (Oral)   Ht 6' 4 (1.93 m)   Wt 234 lb 11.2 oz (106.5 kg)   SpO2 100%   BMI 28.57 kg/m     Physical Exam Vitals and nursing note reviewed.  Constitutional:      General: He is not in acute distress.    Appearance: Normal appearance.  HENT:     Head: Normocephalic and atraumatic.  Eyes:     General: No scleral icterus.    Conjunctiva/sclera: Conjunctivae normal.  Cardiovascular:     Rate and Rhythm: Normal rate.  Pulmonary:     Effort: Pulmonary  effort is normal.  Neurological:     Mental Status: He is alert and oriented to person, place, and time. Mental status is at baseline.  Psychiatric:        Mood and Affect: Mood normal.        Behavior: Behavior normal.      Results for orders placed or performed in visit on 04/03/24  CBC  Result Value Ref Range   WBC 7.2 3.4 - 10.8 x10E3/uL   RBC 5.22 4.14 - 5.80 x10E6/uL   Hemoglobin 17.0 13.0 - 17.7 g/dL   Hematocrit 50.5 62.4 - 51.0 %   MCV 95 79 - 97 fL   MCH 32.6 26.6 - 33.0 pg   MCHC 34.4 31.5 - 35.7 g/dL   RDW 87.4 88.3 - 84.5 %   Platelets 112 (L) 150 - 450 x10E3/uL  Lipid panel  Result Value Ref Range   Cholesterol, Total 124 100 - 199 mg/dL   Triglycerides 894 0 - 149 mg/dL   HDL 48 >60 mg/dL   VLDL Cholesterol Cal 19 5 - 40 mg/dL   LDL Chol Calc (NIH) 57 0 - 99 mg/dL   Chol/HDL Ratio 2.6 0.0 - 5.0 ratio  Comprehensive Metabolic Panel (CMET)  Result Value Ref Range   Glucose 171 (H) 70 - 99 mg/dL   BUN 21 6 - 24 mg/dL   Creatinine, Ser 8.92 0.76 - 1.27 mg/dL   eGFR 80 >40 fO/fpw/8.26   BUN/Creatinine Ratio 20 9 - 20   Sodium 140 134 - 144 mmol/L   Potassium 4.9 3.5 - 5.2 mmol/L   Chloride 102 96 - 106 mmol/L   CO2 23 20 - 29 mmol/L   Calcium  10.5 (H) 8.7 - 10.2 mg/dL   Total Protein 6.9 6.0 - 8.5 g/dL   Albumin 4.8 3.8 - 4.9 g/dL   Globulin, Total 2.1 1.5 - 4.5 g/dL   Bilirubin Total 1.6 (H) 0.0 - 1.2 mg/dL   Alkaline Phosphatase 175 (H) 47 - 123 IU/L   AST 45 (H) 0 - 40 IU/L   ALT 57 (H) 0 - 44 IU/L  Hemoglobin A1c  Result Value Ref Range   Hgb A1c MFr Bld 7.9 (H) 4.8 - 5.6 %   Est. average glucose Bld gHb Est-mCnc 180 mg/dL  Urine Microalbumin w/creat. ratio  Result Value Ref Range   Creatinine, Urine 79.4 Not Estab. mg/dL   Microalbumin, Urine <6.9 Not Estab. ug/mL   Microalb/Creat Ratio <4 0 - 29 mg/g creat     Assessment & Plan    Elevated alkaline phosphatase level -     Comprehensive metabolic panel with GFR  Type 2 diabetes mellitus  with diabetic polyneuropathy, without long-term current use of insulin (HCC) Assessment & Plan: Chronic, uncontrolled.  Blood sugars previously well-managed with exercise and Mounjaro  but he is unable to tolerate Mounjaro  long-term.  Did not tolerate Victoza.  Hesitant to try Ozempic due to past GLP-1 agonist intolerance. Considering Januvia for better tolerance. Discussed potential switch to insulin if Januvia is not tolerated.  - Prescribed Januvia. - Ordered metabolic panel, A1c, and urine analysis. - Discussed continuous glucose monitoring options. - Scheduled follow-up for mid-December to assess Januvia response.  Orders: -     Comprehensive metabolic panel with GFR -     Hemoglobin A1c -     Microalbumin / creatinine urine ratio  Other constipation  Other fatigue  Thrombocytopenia -     CBC  Hyperlipidemia associated with type 2 diabetes mellitus (HCC) Assessment & Plan: Chronic, stable.  Continue atorvastatin  80 mg daily and ezetimibe  10 mg daily.  Recheck lipid panel today.  Orders: -     Lipid panel  Elevated vitamin B12 level -     Vitamin B12  Hypertension associated with diabetes (HCC) Assessment & Plan: Chronic, stable.  Continue Entresto  and carvedilol .  No acute concerns.  Continue to monitor.    Nonimmune to hepatitis B virus     Other constipation Chronic constipation improved after metformin  discontinuation. Recent exacerbation possibly related to Mounjaro .  Other fatigue Significant fatigue potentially related to Mounjaro . Previous improvement after metformin  discontinuation. Current fatigue impacting daily functioning. - Monitor fatigue and assess response to Januvia.  Elevated alkaline phosphatase Noted on recent metabolic panel.  Will recheck today.  Thrombocytopenia Noted.  Will recheck CBC today.  No concerning signs or symptoms today.  Nonimmune to hepatitis B virus Not immune to hepatitis B, noted 06/22/2022.  - Offered hepatitis B  vaccination; patient declined for today.    Return in about 6 weeks (around 05/14/2024) for DM.      I discussed the assessment and treatment plan with the patient  The patient was provided an opportunity to ask questions and all were answered. The patient agreed with the plan and demonstrated an understanding of the instructions.   The patient was advised to call back or seek an in-person evaluation if the symptoms worsen or if the condition fails to improve as anticipated.    LAURAINE LOISE BUOY, DO  [facility] [phone] (phone) [phone] (fax)  [facility]

## 2024-04-04 LAB — MICROALBUMIN / CREATININE URINE RATIO
Creatinine, Urine: 79.4 mg/dL
Microalb/Creat Ratio: <4 mg/g{creat} (ref 0–29)
Microalbumin, Urine: <3 ug/mL

## 2024-04-04 LAB — CBC
Hematocrit: 49.4 % (ref 37.5–51.0)
Hemoglobin: 17 g/dL (ref 13.0–17.7)
MCH: 32.6 pg (ref 26.6–33.0)
MCHC: 34.4 g/dL (ref 31.5–35.7)
MCV: 95 fL (ref 79–97)
Platelets: 112 x10E3/uL — ABNORMAL LOW (ref 150–450)
RBC: 5.22 x10E6/uL (ref 4.14–5.80)
RDW: 12.5 % (ref 11.6–15.4)
WBC: 7.2 x10E3/uL (ref 3.4–10.8)

## 2024-04-04 LAB — COMPREHENSIVE METABOLIC PANEL WITH GFR
ALT: 57 IU/L — ABNORMAL HIGH (ref 0–44)
AST: 45 IU/L — ABNORMAL HIGH (ref 0–40)
Albumin: 4.8 g/dL (ref 3.8–4.9)
Alkaline Phosphatase: 175 IU/L — ABNORMAL HIGH (ref 47–123)
BUN/Creatinine Ratio: 20 (ref 9–20)
BUN: 21 mg/dL (ref 6–24)
Bilirubin Total: 1.6 mg/dL — ABNORMAL HIGH (ref 0.0–1.2)
CO2: 23 mmol/L (ref 20–29)
Calcium: 10.5 mg/dL — ABNORMAL HIGH (ref 8.7–10.2)
Chloride: 102 mmol/L (ref 96–106)
Creatinine, Ser: 1.07 mg/dL (ref 0.76–1.27)
Globulin, Total: 2.1 g/dL (ref 1.5–4.5)
Glucose: 171 mg/dL — ABNORMAL HIGH (ref 70–99)
Potassium: 4.9 mmol/L (ref 3.5–5.2)
Sodium: 140 mmol/L (ref 134–144)
Total Protein: 6.9 g/dL (ref 6.0–8.5)
eGFR: 80 mL/min/1.73 (ref >59)

## 2024-04-04 LAB — LIPID PANEL
Chol/HDL Ratio: 2.6 ratio (ref 0.0–5.0)
Cholesterol, Total: 124 mg/dL (ref 100–199)
HDL: 48 mg/dL (ref >39)
LDL Chol Calc (NIH): 57 mg/dL (ref 0–99)
Triglycerides: 105 mg/dL (ref 0–149)
VLDL Cholesterol Cal: 19 mg/dL (ref 5–40)

## 2024-04-04 LAB — HEMOGLOBIN A1C
Est. average glucose Bld gHb Est-mCnc: 180 mg/dL
Hgb A1c MFr Bld: 7.9 % — ABNORMAL HIGH (ref 4.8–5.6)

## 2024-04-05 ENCOUNTER — Telehealth: Payer: Self-pay

## 2024-04-05 ENCOUNTER — Other Ambulatory Visit (HOSPITAL_COMMUNITY): Payer: Self-pay

## 2024-04-05 DIAGNOSIS — E1142 Type 2 diabetes mellitus with diabetic polyneuropathy: Secondary | ICD-10-CM

## 2024-04-05 NOTE — Telephone Encounter (Signed)
 Pharmacy Patient Advocate Encounter   Received notification from Onbase that prior authorization for Januvia 100 mg tablets is required/requested.   Insurance verification completed.   The patient is insured through [employer]   Per test claim:  Metformin , Tradjenta, Jentadueto, Glyxambi or Kirk is preferred by the insurance.  If suggested medication is appropriate, Please send in a new RX and discontinue this one. If not, please advise as to why it's not appropriate so that we may request a Prior Authorization. Please note, some preferred medications may still require a PA.  If the suggested medications have not been trialed and there are no contraindications to their use, the PA will not be submitted, as it will not be approved.

## 2024-04-09 ENCOUNTER — Other Ambulatory Visit (HOSPITAL_COMMUNITY): Payer: Self-pay

## 2024-04-11 ENCOUNTER — Telehealth: Payer: Self-pay

## 2024-04-11 ENCOUNTER — Ambulatory Visit: Payer: Self-pay | Admitting: Family Medicine

## 2024-04-11 DIAGNOSIS — E1142 Type 2 diabetes mellitus with diabetic polyneuropathy: Secondary | ICD-10-CM

## 2024-04-11 MED ORDER — SAXAGLIPTIN HCL 5 MG PO TABS: 5.0000 mg | ORAL_TABLET | Freq: Every day | ORAL | 3 refills | Status: DC

## 2024-04-11 NOTE — Telephone Encounter (Signed)
 Copied from CRM #[id number]. Topic: Clinical - Prescription Issue >> Apr 11, 2024 10:42 AM Rosaria BRAVO wrote: Reason for CRM: Insurance will only cover metformin  or tragenta, not saxagliptin HCl (ONGLYZA) 5 MG TABS tablet  Mandy from [facility] called

## 2024-04-11 NOTE — Addendum Note (Signed)
 Addended by: DONZELLA DOMINO on: 04/11/2024 07:53 AM   Modules accepted: Orders

## 2024-04-12 ENCOUNTER — Other Ambulatory Visit (HOSPITAL_COMMUNITY): Payer: Self-pay

## 2024-04-14 DIAGNOSIS — Z789 Other specified health status: Secondary | ICD-10-CM | POA: Insufficient documentation

## 2024-04-14 MED ORDER — LINAGLIPTIN 5 MG PO TABS
5.0000 mg | ORAL_TABLET | Freq: Every day | ORAL | 3 refills | Status: AC
Start: 2024-04-14 — End: ?

## 2024-04-14 NOTE — Addendum Note (Signed)
 Addended by: DONZELLA DOMINO on: 04/14/2024 12:40 PM   Modules accepted: Orders

## 2024-04-14 NOTE — Assessment & Plan Note (Signed)
 Chronic, stable.  Continue Entresto  and carvedilol .  No acute concerns.  Continue to monitor.

## 2024-04-14 NOTE — Assessment & Plan Note (Signed)
 Chronic, stable.  Continue atorvastatin  80 mg daily and ezetimibe  10 mg daily.  Recheck lipid panel today.

## 2024-05-08 DIAGNOSIS — H4010X0 Unspecified open-angle glaucoma, stage unspecified: Secondary | ICD-10-CM | POA: Diagnosis not present

## 2024-05-08 DIAGNOSIS — H40059 Ocular hypertension, unspecified eye: Secondary | ICD-10-CM | POA: Diagnosis not present

## 2024-05-08 DIAGNOSIS — E113313 Type 2 diabetes mellitus with moderate nonproliferative diabetic retinopathy with macular edema, bilateral: Secondary | ICD-10-CM | POA: Diagnosis not present

## 2024-05-08 LAB — OPHTHALMOLOGY REPORT-SCANNED

## 2024-05-15 ENCOUNTER — Ambulatory Visit: Admitting: Family Medicine

## 2024-06-20 ENCOUNTER — Other Ambulatory Visit: Payer: Self-pay | Admitting: Gastroenterology

## 2024-06-20 DIAGNOSIS — K7469 Other cirrhosis of liver: Secondary | ICD-10-CM

## 2024-06-28 ENCOUNTER — Ambulatory Visit
Admission: RE | Admit: 2024-06-28 | Discharge: 2024-06-28 | Disposition: A | Discharge status: Home / Self Care | Source: Ambulatory Visit | Attending: Gastroenterology | Admitting: Gastroenterology

## 2024-06-28 DIAGNOSIS — K7469 Other cirrhosis of liver: Secondary | ICD-10-CM | POA: Diagnosis present

## 2024-07-30 ENCOUNTER — Ambulatory Visit: Admit: 2024-07-30
